# Patient Record
Sex: Female | Born: 1995 | Race: Black or African American | Hispanic: No | Smoking: Never smoker
Health system: Southern US, Community
[De-identification: ages and names within clinical notes are randomized; demographics above are authoritative.]

## PROBLEM LIST (undated history)

## (undated) ENCOUNTER — Ambulatory Visit

## (undated) DIAGNOSIS — G43909 Migraine, unspecified, not intractable, without status migrainosus: Secondary | ICD-10-CM

## (undated) DIAGNOSIS — A6000 Herpesviral infection of urogenital system, unspecified: Secondary | ICD-10-CM

## (undated) DIAGNOSIS — F32A Depression, unspecified: Secondary | ICD-10-CM

## (undated) DIAGNOSIS — D649 Anemia, unspecified: Secondary | ICD-10-CM

## (undated) DIAGNOSIS — D219 Benign neoplasm of connective and other soft tissue, unspecified: Secondary | ICD-10-CM

## (undated) DIAGNOSIS — N390 Urinary tract infection, site not specified: Secondary | ICD-10-CM

## (undated) DIAGNOSIS — F419 Anxiety disorder, unspecified: Secondary | ICD-10-CM

## (undated) HISTORY — PX: TONSILLECTOMY: SUR1361

---

## 2020-01-05 ENCOUNTER — Encounter (HOSPITAL_COMMUNITY): Payer: Self-pay | Admitting: Obstetrics and Gynecology

## 2020-01-05 ENCOUNTER — Emergency Department (HOSPITAL_COMMUNITY)
Admission: EM | Admit: 2020-01-05 | Discharge: 2020-01-05 | Disposition: A | Discharge status: Home / Self Care | Payer: Self-pay | Attending: Emergency Medicine | Admitting: Emergency Medicine

## 2020-01-05 ENCOUNTER — Other Ambulatory Visit: Payer: Self-pay

## 2020-01-05 DIAGNOSIS — N939 Abnormal uterine and vaginal bleeding, unspecified: Secondary | ICD-10-CM | POA: Insufficient documentation

## 2020-01-05 LAB — CBC
HCT: 38.9 % (ref 36.0–46.0)
Hemoglobin: 12.4 g/dL (ref 12.0–15.0)
MCH: 27.5 pg (ref 26.0–34.0)
MCHC: 31.9 g/dL (ref 30.0–36.0)
MCV: 86.3 fL (ref 80.0–100.0)
Platelets: 345 10*3/uL (ref 150–400)
RBC: 4.51 MIL/uL (ref 3.87–5.11)
RDW: 14.6 % (ref 11.5–15.5)
WBC: 7.4 10*3/uL (ref 4.0–10.5)
nRBC: 0 % (ref 0.0–0.2)

## 2020-01-05 LAB — BASIC METABOLIC PANEL
Anion gap: 6 (ref 5–15)
BUN: 15 mg/dL (ref 6–20)
CO2: 28 mmol/L (ref 22–32)
Calcium: 9.1 mg/dL (ref 8.9–10.3)
Chloride: 101 mmol/L (ref 98–111)
Creatinine, Ser: 0.7 mg/dL (ref 0.44–1.00)
GFR calc Af Amer: >60 mL/min (ref >60)
GFR calc non Af Amer: >60 mL/min (ref >60)
Glucose, Bld: 92 mg/dL (ref 70–99)
Potassium: 3.9 mmol/L (ref 3.5–5.1)
Sodium: 135 mmol/L (ref 135–145)

## 2020-01-05 LAB — I-STAT BETA HCG BLOOD, ED (MC, WL, AP ONLY): I-stat hCG, quantitative: <5 m[IU]/mL (ref <5)

## 2020-01-05 NOTE — ED Triage Notes (Signed)
Patient reports she has been having stranger period-like symptoms with no period. Patient denies chance of pregnancy and reports 2 negative tests recently. Patient endorses unprotected sex. Patient denies STI symptoms.

## 2020-01-05 NOTE — Discharge Instructions (Signed)
Having vaginal spotting is a very common thing.  As long as you have a negative pregnancy test and you are otherwise feeling okay this is normally not something to be concerned about. If you develop fevers, have vaginal bleeding with a positive pregnancy test, have menstrual cycles that do not stop for multiple weeks and you feel lightheaded, or you have other concerns please seek additional medical care.  I have given you the information for the [facility] outpatient clinic, you can call them to schedule an appointment.

## 2020-01-05 NOTE — ED Provider Notes (Signed)
[facility] DEPT Provider Note   CSN: [id number] Arrival date & time: 01/05/20  1435     History Chief Complaint  Patient presents with  . Vaginal Bleeding    Sheri Lawrence is a 24 y.o. female who presents today for evaluation of menstrual spotting.  She reports that she had menstrual spotting 6 to 8 days ago and then started her normal menstrual cycle after.  She reports that she is currently having a normal menstrual cycle.  Her previous one was normal.   She denies any abnormal pelvic pain.  She states that she recently got back from [facility] and shortly before she went she got tested for STIs so she is not concerned about that.  She denies any trauma.   HPI     History reviewed. No pertinent past medical history.  There are no problems to display for this patient.   History reviewed. No pertinent surgical history.   OB History   No obstetric history on file.     History reviewed. No pertinent family history.  Social History   Tobacco Use  . Smoking status: Never Smoker  . Smokeless tobacco: Never Used  Substance Use Topics  . Alcohol use: Not on file  . Drug use: Not on file    Home Medications Prior to Admission medications   Not on File    Allergies    Patient has no allergy information on record.  Review of Systems   Review of Systems  Constitutional: Negative for chills and fever.  Genitourinary: Positive for menstrual problem and vaginal bleeding. Negative for vaginal discharge.  Musculoskeletal: Negative for back pain and neck pain.  All other systems reviewed and are negative.   Physical Exam Updated Vital Signs BP 135/79 (BP Location: Right Arm)   Pulse 76   Temp 98.7 F (37.1 C) (Oral)   Resp 18   Ht 5\' 4"  (1.626 m)   Wt 83 kg   LMP 11/28/2019 (Exact Date)   SpO2 99%   BMI 31.41 kg/m   Physical Exam Vitals and nursing note reviewed.  Constitutional:      General: She is not in acute  distress.    Appearance: She is not ill-appearing.  HENT:     Head: Normocephalic.  Cardiovascular:     Rate and Rhythm: Normal rate.  Pulmonary:     Effort: Pulmonary effort is normal. No respiratory distress.  Neurological:     Mental Status: She is alert.  Psychiatric:        Mood and Affect: Mood normal.        Behavior: Behavior normal.     ED Results / Procedures / Treatments   Labs (all labs ordered are listed, but only abnormal results are displayed) Labs Reviewed  CBC  BASIC METABOLIC PANEL  I-STAT BETA HCG BLOOD, ED ([facility], [facility], [facility] ONLY)    EKG None  Radiology No results found.  Procedures Procedures (including critical care time)  Medications Ordered in ED Medications - No data to display  ED Course  I have reviewed the triage vital signs and the nursing notes.  Pertinent labs & imaging results that were available during my care of the patient were reviewed by me and considered in my medical decision making (see chart for details).    MDM Rules/Calculators/A&P                         Patient is a 24 year old woman  who presents today for evaluation of 2 days of vaginal spotting preceding her normal menstrual cycle.  The vaginal spotting occurred over a week ago.  She denies any fevers, or abnormal pelvic pain.  No concern for STIs.  Labs are obtained by triage, hCG is negative.  CMP and BMP are unremarkable.  She denies any concern for trauma or tears. She reports she has never had spotting before which caused her concern.   Patient reassured.    Outpatient follow up ordered.   Return precautions were discussed with patient who states their understanding.  At the time of discharge patient denied any unaddressed complaints or concerns.  Patient is agreeable for discharge home.  Note: Portions of this report may have been transcribed using voice recognition software. Every effort was made to ensure accuracy; however, inadvertent computerized transcription  errors may be present.  Final Clinical Impression(s) / ED Diagnoses Final diagnoses:  Vaginal spotting    Rx / DC Orders ED Discharge Orders    None       Lorin Glass, PA-C 01/05/20 1812    Lennice Sites, DO 01/05/20 1903

## 2020-01-20 ENCOUNTER — Encounter (HOSPITAL_COMMUNITY): Payer: Self-pay | Admitting: Physician Assistant

## 2020-02-04 ENCOUNTER — Encounter (HOSPITAL_COMMUNITY): Payer: Self-pay | Admitting: Emergency Medicine

## 2020-02-04 ENCOUNTER — Emergency Department (HOSPITAL_COMMUNITY)
Admission: EM | Admit: 2020-02-04 | Discharge: 2020-02-04 | Disposition: A | Discharge status: Home / Self Care | Payer: Self-pay | Attending: Emergency Medicine | Admitting: Emergency Medicine

## 2020-02-04 DIAGNOSIS — Z5321 Procedure and treatment not carried out due to patient leaving prior to being seen by health care provider: Secondary | ICD-10-CM | POA: Insufficient documentation

## 2020-02-04 DIAGNOSIS — Y999 Unspecified external cause status: Secondary | ICD-10-CM | POA: Insufficient documentation

## 2020-02-04 DIAGNOSIS — Y92410 Unspecified street and highway as the place of occurrence of the external cause: Secondary | ICD-10-CM | POA: Insufficient documentation

## 2020-02-04 DIAGNOSIS — Y9389 Activity, other specified: Secondary | ICD-10-CM | POA: Insufficient documentation

## 2020-02-04 DIAGNOSIS — M79642 Pain in left hand: Secondary | ICD-10-CM | POA: Insufficient documentation

## 2020-02-04 NOTE — ED Notes (Signed)
Patient did not answer for room. Informed by lobby staff patient left
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
## 2020-02-06 ENCOUNTER — Telehealth (HOSPITAL_COMMUNITY): Payer: Self-pay

## 2020-02-06 ENCOUNTER — Ambulatory Visit (HOSPITAL_COMMUNITY)
Admission: RE | Admit: 2020-02-06 | Discharge: 2020-02-06 | Disposition: A | Discharge status: Home / Self Care | Payer: Self-pay | Source: Ambulatory Visit | Attending: Family Medicine | Admitting: Family Medicine

## 2020-02-06 ENCOUNTER — Encounter (HOSPITAL_COMMUNITY): Payer: Self-pay

## 2020-02-06 ENCOUNTER — Other Ambulatory Visit: Payer: Self-pay

## 2020-02-06 VITALS — BP 116/64 | HR 72 | Temp 98.7°F | Resp 17

## 2020-02-06 DIAGNOSIS — M545 Low back pain, unspecified: Secondary | ICD-10-CM

## 2020-02-06 MED ORDER — CYCLOBENZAPRINE HCL 10 MG PO TABS
5.0000 mg | ORAL_TABLET | Freq: Every day | ORAL | 0 refills | Status: DC
Start: 2020-02-06 — End: 2020-07-01

## 2020-02-06 MED ORDER — IBUPROFEN 600 MG PO TABS
600.0000 mg | ORAL_TABLET | Freq: Three times a day (TID) | ORAL | 0 refills | Status: DC | PRN
Start: 2020-02-06 — End: 2020-07-01
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
## 2020-02-06 NOTE — Discharge Instructions (Signed)
Medicine as needed Work note for light duty  Rest, heat to the back.  Follow up as needed for continued or worsening symptoms
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
## 2020-05-26 NOTE — L&D Delivery Note (Signed)
[facility] Delivery Note  Sheri Lawrence is a 25 y.o. G1P0 s/p vag del at [redacted]w[redacted]d. She was admitted for PROM.   ROM: 18h 65m with clear fluid GBS Status: neg Maximum Maternal Temperature: 98.5  Labor Progress: Ms Vannest was admitted with PROM at midnight on 02/12/21 with expectant management overnight and a buccal cytotec for ripening in the morning, which began her labor and she progressed to complete, delivering in the tub.  Delivery Date/Time: February 12, 2021 at 1805 Delivery: Called to room and patient was complete and pushing. Head delivered LOA. No nuchal cord present. Shoulder and body delivered in usual fashion. Infant with spontaneous cry, placed on mother's abdomen, dried and stimulated. Cord clamped x 2 after a delay, and cut by FOB. Pt assisted to the bed without difficulty. Cord blood drawn. Placenta delivered spontaneously with gentle cord traction. Fundus firm with massage and Pitocin. Labia, perineum, vagina, and cervix inspected and found to be intact.   Placenta: spont, intact; to L&D Complications: none Lacerations: none EBL: 300cc Analgesia: none  Postpartum Planning [x]  message to sent to schedule follow-up   Infant: girl  APGARs 9/9  2934g (6lb 7.5oz)  [name], CNM  02/12/2021 6:45 PM

## 2020-06-30 ENCOUNTER — Other Ambulatory Visit: Payer: Self-pay

## 2020-06-30 ENCOUNTER — Encounter (HOSPITAL_COMMUNITY): Payer: Self-pay | Admitting: Emergency Medicine

## 2020-06-30 DIAGNOSIS — O23591 Infection of other part of genital tract in pregnancy, first trimester: Secondary | ICD-10-CM | POA: Insufficient documentation

## 2020-06-30 DIAGNOSIS — B9689 Other specified bacterial agents as the cause of diseases classified elsewhere: Secondary | ICD-10-CM | POA: Insufficient documentation

## 2020-06-30 DIAGNOSIS — Z3A01 Less than 8 weeks gestation of pregnancy: Secondary | ICD-10-CM | POA: Insufficient documentation

## 2020-06-30 DIAGNOSIS — O209 Hemorrhage in early pregnancy, unspecified: Secondary | ICD-10-CM | POA: Insufficient documentation

## 2020-06-30 NOTE — ED Triage Notes (Signed)
Patient reports 6 weeks today and had blood on toilet paper after urinating tonight. Denies pain.

## 2020-07-01 ENCOUNTER — Emergency Department (HOSPITAL_COMMUNITY)
Admission: EM | Admit: 2020-07-01 | Discharge: 2020-07-01 | Disposition: A | Discharge status: Home / Self Care | Payer: Self-pay | Attending: Emergency Medicine | Admitting: Emergency Medicine

## 2020-07-01 ENCOUNTER — Emergency Department (HOSPITAL_COMMUNITY): Payer: Self-pay

## 2020-07-01 DIAGNOSIS — B9689 Other specified bacterial agents as the cause of diseases classified elsewhere: Secondary | ICD-10-CM

## 2020-07-01 DIAGNOSIS — O4690 Antepartum hemorrhage, unspecified, unspecified trimester: Secondary | ICD-10-CM

## 2020-07-01 LAB — I-STAT BETA HCG BLOOD, ED (MC, WL, AP ONLY): I-stat hCG, quantitative: >2000 m[IU]/mL — ABNORMAL HIGH (ref <5)

## 2020-07-01 LAB — URINALYSIS, ROUTINE W REFLEX MICROSCOPIC
Bacteria, UA: NONE SEEN
Bilirubin Urine: NEGATIVE
Glucose, UA: NEGATIVE mg/dL
Ketones, ur: 5 mg/dL — AB
Leukocytes,Ua: NEGATIVE
Nitrite: NEGATIVE
Protein, ur: 30 mg/dL — AB
Specific Gravity, Urine: 1.027 (ref 1.005–1.030)
pH: 5 (ref 5.0–8.0)

## 2020-07-01 LAB — WET PREP, GENITAL
Sperm: NONE SEEN
Trich, Wet Prep: NONE SEEN
Yeast Wet Prep HPF POC: NONE SEEN

## 2020-07-01 LAB — HCG, QUANTITATIVE, PREGNANCY: hCG, Beta Chain, Quant, S: 20027 m[IU]/mL — ABNORMAL HIGH (ref <5)

## 2020-07-01 NOTE — Discharge Instructions (Addendum)
Thank you for allowing me to care for you today in the Emergency Department.   You were seen today for vaginal bleeding.  You had a positive pregnancy test.  We did a pelvic ultrasound that measured that you are 5 weeks and 5 days on your pregnancy.  It was recommended that you have a repeat ultrasound in 1 week to measure the baby's heart rate since right now you are around the time of the pregnancy where this can be very difficult to measure.  Please follow-up with your OB/GYN in 2 to 3 days to have your hCG levels rechecked.  I have also provided you with an OB/GYN referral in [facility] if you would prefer to have a local OB/GYN.  Their office information is listed above.  Return to the emergency department if you have significantly worsening vaginal bleeding, high fevers, uncontrollable vomiting, severe abdominal pain, if you pass out, or have other new, concerning symptoms.  Please note that at [facility] in [place] that we also have a special unit called the women's and children's Center where you can present for any emergent complaints related to your pregnancy 24/7.  [place] at [facility] [address] [place],  [place]  [id number]

## 2020-07-01 NOTE — ED Notes (Signed)
Discharge paperwork reviewed with pt, including f/u prenatal care and referrals.  Pt verbalized understanding, ambulatory to ED exit.

## 2020-07-01 NOTE — ED Provider Notes (Signed)
[facility] DEPT Provider Note   CSN: [id number] Arrival date & time: 06/30/20  2157     History Chief Complaint  Patient presents with  . Vaginal Bleeding    Sheri Lawrence is a 25 y.o. female, G1P0, who presents the emergency department with a chief complaint of vaginal bleeding.  The patient endorses vaginal bleeding, onset today.  She reports that she had an at home pregnancy test that was + January 24.  Her last menstrual cycle was December 25.  She has a scheduled OB/GYN appointment upcoming with an OB/GYN practice in [facility] [place], [place].  Approximately 5 days ago, she began having intermittent abdominal cramping and spoke with the OB/GYN practice and was advised that this can be normal in early pregnancy, but if she developed vaginal fluid leaking or bleeding that she should go to the ER.  She reports that earlier tonight that she was wiping and noticed some pink tinged blood on the toilet tissue.  She also had a small amount of bright red blood in the toilet.  No clots.  She continues to have abdominal pain and cramping that is intermittent.  No known aggravating or alleviating factors.  She denies chest pain, shortness of breath, back pain, dysuria, hematuria, vaginal discharge nausea, vomiting, diarrhea, constipation.  She has no chronic medical conditions.  She is sexually active with one female partner.  No concerns for STIs.  She has already initiated taking daily prenatal vitamins.  The history is provided by the patient and medical records. No language interpreter was used.       History reviewed. No pertinent past medical history.  There are no problems to display for this patient.   History reviewed. No pertinent surgical history.   OB History   No obstetric history on file.     Family History  Family history unknown: Yes    Social History   Tobacco Use  . Smoking status: Never Smoker  . Smokeless tobacco:  Never Used    Home Medications Prior to Admission medications   Medication Sig Start Date End Date Taking? Authorizing Provider  Prenatal Vit-Fe Fumarate-FA (PRENATAL PO) Take 2 tablets by mouth daily. Prenatal Gummies   Yes [provider]    Allergies    Patient has no known allergies.  Review of Systems   Review of Systems  Constitutional: Negative for activity change, chills and fever.  HENT: Negative for congestion and sore throat.   Respiratory: Negative for cough, shortness of breath and wheezing.   Cardiovascular: Negative for chest pain and palpitations.  Gastrointestinal: Positive for abdominal pain. Negative for constipation, diarrhea, nausea and vomiting.  Genitourinary: Positive for vaginal bleeding. Negative for dysuria.  Musculoskeletal: Negative for back pain and myalgias.  Skin: Negative for rash and wound.  Allergic/Immunologic: Negative for immunocompromised state.  Neurological: Negative for dizziness, seizures, syncope, weakness, numbness and headaches.  Psychiatric/Behavioral: Negative for confusion.    Physical Exam Updated Vital Signs BP 134/78   Pulse 80   Temp 97.9 F (36.6 C) (Oral)   Resp 15   Ht 5\' 4"  (1.626 m)   Wt 88 kg   SpO2 100%   BMI 33.30 kg/m   Physical Exam Vitals and nursing note reviewed.  Constitutional:      General: She is not in acute distress.    Appearance: She is not ill-appearing, toxic-appearing or diaphoretic.  HENT:     Head: Normocephalic.     Mouth/Throat:  Mouth: Mucous membranes are moist.     Pharynx: No oropharyngeal exudate or posterior oropharyngeal erythema.  Eyes:     Conjunctiva/sclera: Conjunctivae normal.  Cardiovascular:     Rate and Rhythm: Normal rate and regular rhythm.     Pulses: Normal pulses.     Heart sounds: Normal heart sounds. No murmur heard. No friction rub. No gallop.   Pulmonary:     Effort: Pulmonary effort is normal. No respiratory distress.     Breath sounds: No  stridor. No wheezing, rhonchi or rales.  Chest:     Chest wall: No tenderness.  Abdominal:     General: There is no distension.     Palpations: Abdomen is soft. There is no mass.     Tenderness: There is no abdominal tenderness. There is no right CVA tenderness, left CVA tenderness, guarding or rebound.     Hernia: No hernia is present.  Genitourinary:    Comments: Chaperoned exam.  Scants amount of blood noted in the vaginal vault  Cervix is closed.  No cervical motion tenderness.  No adnexal masses or tenderness Musculoskeletal:     Cervical back: Neck supple.     Right lower leg: No edema.     Left lower leg: No edema.  Skin:    General: Skin is warm.     Findings: No rash.  Neurological:     Mental Status: She is alert.  Psychiatric:        Behavior: Behavior normal.     ED Results / Procedures / Treatments   Labs (all labs ordered are listed, but only abnormal results are displayed) Labs Reviewed  WET PREP, GENITAL - Abnormal; Notable for the following components:      Result Value   Clue Cells Wet Prep HPF POC PRESENT (*)    WBC, Wet Prep HPF POC RARE (*)    All other components within normal limits  HCG, QUANTITATIVE, PREGNANCY - Abnormal; Notable for the following components:   hCG, Beta Chain, Quant, S 20,027 (*)    All other components within normal limits  URINALYSIS, ROUTINE W REFLEX MICROSCOPIC - Abnormal; Notable for the following components:   Hgb urine dipstick MODERATE (*)    Ketones, ur 5 (*)    Protein, ur 30 (*)    All other components within normal limits  I-STAT BETA HCG BLOOD, ED (MC, WL, AP ONLY) - Abnormal; Notable for the following components:   I-stat hCG, quantitative >2,000.0 (*)    All other components within normal limits  GC/CHLAMYDIA PROBE AMP ([place]) NOT AT [facility]    EKG None  Radiology US OB Comp < 14 Wks  Result Date: 07/01/2020 CLINICAL DATA:  Vaginal bleeding. Last menstrual period 05/19/2020. Gestational age by last  menstrual period of 6 weeks and 1 day. Estimated due date by last menstrual period 02/23/2021. Quantitative beta HCG of 20,027 EXAM: OBSTETRIC <14 WK [place] AND TRANSVAGINAL OB US TECHNIQUE: Both transabdominal and transvaginal ultrasound examinations were performed for complete evaluation of the gestation as well as the maternal uterus, adnexal regions, and pelvic cul-de-sac. Transvaginal technique was performed to assess early pregnancy. COMPARISON:  None. FINDINGS: Intrauterine gestational sac: Single Yolk sac:  Visualized. Embryo:  Visualized. Cardiac Activity: Visualized. Heart Rate: Per ultrasound technologist, cardiac activity is visualized but difficult to measure. CRL:  2 mm   5 w   5 d                  [place] EDC:  02/26/2021 Subchorionic hemorrhage:  None visualized. Maternal uterus/adnexae: There is a posterior fundal intramural hypodense lesion measuring 1.8 x 1.6 x 1.4 cm consistent with a uterine fibroid. The remainder of the uterus is unremarkable. The right ovary is unremarkable and measures 2.3 x 4.3 x 1.6 cm. The left ovary is unremarkable and measures 2 x 3 x 1.2 cm. Bilateral adnexal regions are otherwise unremarkable. Other: No free intraperitoneal fluid within the pelvis. IMPRESSION: 1. Single intrauterine pregnancy with difficulty to obtain fetal heart rate even though cardiac activity is visualized. This may be due to an early intrauterine pregnancy timeline. Recommend repeat ultrasound in 7 days for further evaluation of viability. 2. Gestational age by ultrasound of 5 weeks and 5 days is concordant with gestational age by last menstrual period of 6 weeks and 1 day. 3. Posterior fundal intramural uterine fibroid measuring up to 1.8 cm. Electronically Signed   By: Iven Finn M.D.   On: 07/01/2020 03:52   US OB Transvaginal  Result Date: 07/01/2020 CLINICAL DATA:  Vaginal bleeding. Last menstrual period 05/19/2020. Gestational age by last menstrual period of 6 weeks and 1 day. Estimated due  date by last menstrual period 02/23/2021. Quantitative beta HCG of 20,027 EXAM: OBSTETRIC <14 WK [place] AND TRANSVAGINAL OB US TECHNIQUE: Both transabdominal and transvaginal ultrasound examinations were performed for complete evaluation of the gestation as well as the maternal uterus, adnexal regions, and pelvic cul-de-sac. Transvaginal technique was performed to assess early pregnancy. COMPARISON:  None. FINDINGS: Intrauterine gestational sac: Single Yolk sac:  Visualized. Embryo:  Visualized. Cardiac Activity: Visualized. Heart Rate: Per ultrasound technologist, cardiac activity is visualized but difficult to measure. CRL:  2 mm   5 w   5 d                  [place] EDC: 02/26/2021 Subchorionic hemorrhage:  None visualized. Maternal uterus/adnexae: There is a posterior fundal intramural hypodense lesion measuring 1.8 x 1.6 x 1.4 cm consistent with a uterine fibroid. The remainder of the uterus is unremarkable. The right ovary is unremarkable and measures 2.3 x 4.3 x 1.6 cm. The left ovary is unremarkable and measures 2 x 3 x 1.2 cm. Bilateral adnexal regions are otherwise unremarkable. Other: No free intraperitoneal fluid within the pelvis. IMPRESSION: 1. Single intrauterine pregnancy with difficulty to obtain fetal heart rate even though cardiac activity is visualized. This may be due to an early intrauterine pregnancy timeline. Recommend repeat ultrasound in 7 days for further evaluation of viability. 2. Gestational age by ultrasound of 5 weeks and 5 days is concordant with gestational age by last menstrual period of 6 weeks and 1 day. 3. Posterior fundal intramural uterine fibroid measuring up to 1.8 cm. Electronically Signed   By: Iven Finn M.D.   On: 07/01/2020 03:52    Procedures Procedures   Medications Ordered in ED Medications - No data to display  ED Course  I have reviewed the triage vital signs and the nursing notes.  Pertinent labs & imaging results that were available during my care of the  patient were reviewed by me and considered in my medical decision making (see chart for details).    MDM Rules/Calculators/A&P                          25 year old G51P0 female with no chronic medical conditions who presents to the emergency department with a chief complaint of vaginal bleeding, onset tonight with intermittent abdominal cramping over  the last few days.  She had a positive home pregnancy test on January 24 and her last menstrual cycle was 12/25.  Differential diagnosis includes ectopic pregnancy, miscarriage, subchorionic hemorrhage, STI, UTI, obstructive ureterolithiasis.  Vital signs are stable.  Labs and imaging have been reviewed and independently interpreted by me.  I-STAT hCG greater than 2000.  Quantitative hCG is 20K.  Pelvic ultrasound with single intrauterine pregnancy.  Cardiac activity is visualized, but fetal heart rate is difficult to obtain.  This is thought to be secondary to an early intrauterine pregnancy timeline.  This would not be unexpected as gestational age as measured at 44 weeks and 5 days and patient's last menstrual cycle was approximately 6 weeks ago.  There is also a posterior fundal intra-year-old uterine fibroid measuring 1.8 cm.  Given scant amount of vaginal bleeding, obtaining CBC is not indicated at this time.  UA with mild proteinuria.  No evidence of bacteria.  Wet prep with clue cells.  Will defer treatment at this time until she is seen by her OB/GYN since she is in the first trimester.  Patient is aware that GC chlamydia are pending, but I have a low suspicion for positive test at this time to her warrant prophylactic management given patient's sexual history.  The patient has been advised that she needs to follow-up with OB/GYN for repeat hCG in 2 to 3 days.  She also needs to follow-up for a repeat pelvic ultrasound in 1 week.  All questions answered.  The patient is hemodynamically stable and in no acute distress.  Safe for discharge home with  outpatient follow-up to OB/GYN.  ER return precautions given.  Final Clinical Impression(s) / ED Diagnoses Final diagnoses:  Vaginal bleeding in pregnancy  BV (bacterial vaginosis)    Rx / DC Orders ED Discharge Orders    None       Joanne Gavel, PA-C [id number]    Delora Fuel, MD [id number] [phone]

## 2020-07-02 LAB — GC/CHLAMYDIA PROBE AMP (~~LOC~~) NOT AT ARMC
Chlamydia: NEGATIVE
Comment: NEGATIVE
Comment: NORMAL
Neisseria Gonorrhea: NEGATIVE

## 2020-07-23 ENCOUNTER — Inpatient Hospital Stay (HOSPITAL_COMMUNITY): Payer: BC Managed Care – PPO

## 2020-07-23 ENCOUNTER — Other Ambulatory Visit: Payer: Self-pay

## 2020-07-23 ENCOUNTER — Inpatient Hospital Stay (HOSPITAL_COMMUNITY)
Admission: AD | Admit: 2020-07-23 | Discharge: 2020-07-23 | Disposition: A | Discharge status: Home / Self Care | Payer: BC Managed Care – PPO | Attending: Obstetrics & Gynecology | Admitting: Obstetrics & Gynecology

## 2020-07-23 ENCOUNTER — Encounter (HOSPITAL_COMMUNITY): Payer: Self-pay | Admitting: Obstetrics & Gynecology

## 2020-07-23 DIAGNOSIS — N930 Postcoital and contact bleeding: Secondary | ICD-10-CM

## 2020-07-23 DIAGNOSIS — R102 Pelvic and perineal pain: Secondary | ICD-10-CM

## 2020-07-23 DIAGNOSIS — R1030 Lower abdominal pain, unspecified: Secondary | ICD-10-CM | POA: Diagnosis not present

## 2020-07-23 DIAGNOSIS — O26891 Other specified pregnancy related conditions, first trimester: Secondary | ICD-10-CM | POA: Diagnosis not present

## 2020-07-23 DIAGNOSIS — B9689 Other specified bacterial agents as the cause of diseases classified elsewhere: Secondary | ICD-10-CM | POA: Diagnosis not present

## 2020-07-23 DIAGNOSIS — O23591 Infection of other part of genital tract in pregnancy, first trimester: Secondary | ICD-10-CM | POA: Insufficient documentation

## 2020-07-23 DIAGNOSIS — O209 Hemorrhage in early pregnancy, unspecified: Secondary | ICD-10-CM

## 2020-07-23 DIAGNOSIS — O99891 Other specified diseases and conditions complicating pregnancy: Secondary | ICD-10-CM

## 2020-07-23 DIAGNOSIS — N760 Acute vaginitis: Secondary | ICD-10-CM | POA: Diagnosis not present

## 2020-07-23 DIAGNOSIS — Z3A09 9 weeks gestation of pregnancy: Secondary | ICD-10-CM

## 2020-07-23 DIAGNOSIS — Z3491 Encounter for supervision of normal pregnancy, unspecified, first trimester: Secondary | ICD-10-CM

## 2020-07-23 LAB — CBC
HCT: 34.9 % — ABNORMAL LOW (ref 36.0–46.0)
Hemoglobin: 11 g/dL — ABNORMAL LOW (ref 12.0–15.0)
MCH: 26.8 pg (ref 26.0–34.0)
MCHC: 31.5 g/dL (ref 30.0–36.0)
MCV: 84.9 fL (ref 80.0–100.0)
Platelets: 329 10*3/uL (ref 150–400)
RBC: 4.11 MIL/uL (ref 3.87–5.11)
RDW: 14.2 % (ref 11.5–15.5)
WBC: 7.9 10*3/uL (ref 4.0–10.5)
nRBC: 0 % (ref 0.0–0.2)

## 2020-07-23 LAB — URINALYSIS, ROUTINE W REFLEX MICROSCOPIC
Bacteria, UA: NONE SEEN
Bilirubin Urine: NEGATIVE
Glucose, UA: NEGATIVE mg/dL
Ketones, ur: NEGATIVE mg/dL
Leukocytes,Ua: NEGATIVE
Nitrite: NEGATIVE
Protein, ur: 30 mg/dL — AB
RBC / HPF: >50 RBC/hpf — ABNORMAL HIGH (ref 0–5)
Specific Gravity, Urine: 1.021 (ref 1.005–1.030)
pH: 5 (ref 5.0–8.0)

## 2020-07-23 LAB — WET PREP, GENITAL
Sperm: NONE SEEN
Trich, Wet Prep: NONE SEEN
Yeast Wet Prep HPF POC: NONE SEEN

## 2020-07-23 LAB — GC/CHLAMYDIA PROBE AMP (~~LOC~~) NOT AT ARMC
Chlamydia: NEGATIVE
Comment: NEGATIVE
Comment: NORMAL
Neisseria Gonorrhea: NEGATIVE

## 2020-07-23 LAB — ABO/RH: ABO/RH(D): A POS

## 2020-07-23 LAB — HIV ANTIBODY (ROUTINE TESTING W REFLEX): HIV Screen 4th Generation wRfx: NONREACTIVE

## 2020-07-23 LAB — HCG, QUANTITATIVE, PREGNANCY: hCG, Beta Chain, Quant, S: 88406 m[IU]/mL — ABNORMAL HIGH (ref <5)

## 2020-07-23 MED ORDER — METRONIDAZOLE 500 MG PO TABS: 500.0000 mg | ORAL_TABLET | Freq: Two times a day (BID) | ORAL | 0 refills | Status: DC

## 2020-07-23 NOTE — MAU Provider Note (Signed)
History     CSN: [id number]  Arrival date and time: 07/23/20 0417   Event Date/Time   First Provider Initiated Contact with Patient 07/23/20 [phone]      Chief Complaint  Patient presents with  . Vaginal Bleeding  . Abdominal Pain   Ms. Sheri Lawrence is a 25 y.o. year old G1P0 female at [redacted]w[redacted]d weeks gestation by LMP who presents to MAU reporting vaginal bleeding and lower abdominal cramping after having SI about 2 hours ago. She was seen at [facility] on 07/01/2020, U/S with an IUP at 5.5 wks/(+) YS/(+) embryo/(+) cardiac activity but too small to measure/fundal intramural fibroid 1.8 cm, and HCG 20,027. She was advised to have a follow-up U/S 1 week after the one on 07/01/2020. She was seen at [facility] in [place], [place] on Monday 07/16/2020. She states she had another U/S that "was good" at that visit. She does not plan to receive [facility] in [place], [place]. She lives in [place] area and requesting a list of providers.   OB History    Gravida  1   Para      Term      Preterm      AB      Living        SAB      IAB      Ectopic      Multiple      Live Births              History reviewed. No pertinent past medical history.  Past Surgical History:  Procedure Laterality Date  . TONSILLECTOMY      Family History  Family history unknown: Yes    Social History   Tobacco Use  . Smoking status: Never Smoker  . Smokeless tobacco: Never Used  Vaping Use  . Vaping Use: Never used  Substance Use Topics  . Alcohol use: Not Currently  . Drug use: Never    Allergies: No Known Allergies  Medications Prior to Admission  Medication Sig Dispense Refill Last Dose  . Prenatal Vit-Fe Fumarate-FA (PRENATAL PO) Take 2 tablets by mouth daily. Prenatal Gummies       Review of Systems  Constitutional: Negative.   HENT: Negative.   Eyes: Negative.   Respiratory: Negative.   Cardiovascular: Negative.   Gastrointestinal: Negative.   Endocrine: Negative.   Genitourinary:  Positive for pelvic pain and vaginal bleeding.  Musculoskeletal: Negative.   Skin: Negative.   Allergic/Immunologic: Negative.   Neurological: Negative.   Hematological: Negative.   Psychiatric/Behavioral: Negative.    Physical Exam   Blood pressure 108/60, pulse 96, temperature 98.3 F (36.8 C), temperature source Oral, resp. rate 18, weight 88 kg, last menstrual period 05/19/2020, SpO2 99 %.  Physical Exam Vitals reviewed. Exam conducted with a chaperone present.  Constitutional:      Appearance: Normal appearance. She is normal weight.  Cardiovascular:     Rate and Rhythm: Normal rate.  Abdominal:     Palpations: Abdomen is soft.  Genitourinary:    General: Normal vulva.     Comments: Uterus: non-tender, SE: cervix is smooth, pink, no lesions, small amt of dark, red blood with small blood clot coming out of cervical os -- WP, GC/CT done, closed/long/firm, no CMT or friability, no adnexal tenderness  Musculoskeletal:        General: Normal range of motion.     Cervical back: Normal range of motion.  Skin:    General: Skin is warm and dry.  Neurological:     Mental Status: She is alert and oriented to person, place, and time.  Psychiatric:        Mood and Affect: Mood normal.        Behavior: Behavior normal.        Thought Content: Thought content normal.        Judgment: Judgment normal.     MAU Course  Procedures  MDM CCUA UPT CBC ABO/Rh HCG Wet Prep GC/CT -- pending HIV -- pending OB < 14 wks [place] with TV  Results for orders placed or performed during the hospital encounter of 07/23/20 (from the past 24 hour(s))  Wet prep, genital     Status: Abnormal   Collection Time: 07/23/20  5:21 AM   Specimen: Vaginal  Result Value Ref Range   Yeast Wet Prep HPF POC NONE SEEN NONE SEEN   Trich, Wet Prep NONE SEEN NONE SEEN   Clue Cells Wet Prep HPF POC PRESENT (A) NONE SEEN   WBC, Wet Prep HPF POC FEW (A) NONE SEEN   Sperm NONE SEEN   Urinalysis, Routine w  reflex microscopic Urine, Clean Catch     Status: Abnormal   Collection Time: 07/23/20  5:21 AM  Result Value Ref Range   Color, Urine YELLOW YELLOW   APPearance CLOUDY (A) CLEAR   Specific Gravity, Urine 1.021 1.005 - 1.030   pH 5.0 5.0 - 8.0   Glucose, UA NEGATIVE NEGATIVE mg/dL   Hgb urine dipstick LARGE (A) NEGATIVE   Bilirubin Urine NEGATIVE NEGATIVE   Ketones, ur NEGATIVE NEGATIVE mg/dL   Protein, ur 30 (A) NEGATIVE mg/dL   Nitrite NEGATIVE NEGATIVE   Leukocytes,Ua NEGATIVE NEGATIVE   RBC / HPF >50 (H) 0 - 5 RBC/hpf   WBC, UA 0-5 0 - 5 WBC/hpf   Bacteria, UA NONE SEEN NONE SEEN   Squamous Epithelial / LPF 0-5 0 - 5  ABO/Rh     Status: None   Collection Time: 07/23/20  5:54 AM  Result Value Ref Range   ABO/RH(D) A POS    No rh immune globuloin      NOT A RH IMMUNE GLOBULIN CANDIDATE, PT RH POSITIVE Performed at [facility], [address] [place], [place] [address]   hCG, quantitative, pregnancy     Status: Abnormal   Collection Time: 07/23/20  5:54 AM  Result Value Ref Range   hCG, Beta Chain, Quant, S 88,406 (H) <5 mIU/mL  CBC     Status: Abnormal   Collection Time: 07/23/20  5:54 AM  Result Value Ref Range   WBC 7.9 4.0 - 10.5 K/uL   RBC 4.11 3.87 - 5.11 MIL/uL   Hemoglobin 11.0 (L) 12.0 - 15.0 g/dL   HCT 34.9 (L) 36.0 - 46.0 %   MCV 84.9 80.0 - 100.0 fL   MCH 26.8 26.0 - 34.0 pg   MCHC 31.5 30.0 - 36.0 g/dL   RDW 14.2 11.5 - 15.5 %   Platelets 329 150 - 400 K/uL   nRBC 0.0 0.0 - 0.2 %   Per notations made on U/S images by Dr. [place] Crane; no final report due to downtime -- report will take > 1 hour to generate: Single living IUP @ 9.0 wks by CRL, no [place] noted  FHR found to be 171 bpm on image notations   Assessment and Plan  Postcoital bleeding - Plan: Discharge patient - Explained this is a normal variation in pregnancy, but she must exercise caution and  refrain from SI until VB stops Bleeding in early pregnancy  - Return to MAU:  If you have heavier  bleeding that soaks through more that 2 pads per hour for an hour or more  If you bleed so much that you feel like you might pass out or you do pass out  If you have significant abdominal pain that is not improved with Tylenol 1000 mg every 6 hours as needed for pain  If you develop a fever > 100.5 - Information provided on VB in pregnancy   Bacterial vaginosis - Rx for Flagyl 500 mg BID x 7 days - Information provided on BV   [redacted] weeks gestation of pregnancy  Viable pregnancy in first trimester - Patient notified of U/S results - Encouraged to establish [facility] locally ASAP - List of [place] [facility] providers and safe meds in pregnancy given  Laury Deep, CNM 07/23/2020, 4:57 AM

## 2020-07-23 NOTE — Discharge Instructions (Signed)
Return to MAU:  If you have heavier bleeding that soaks through more that 2 pads per hour for an hour or more  If you bleed so much that you feel like you might pass out or you do pass out  If you have significant abdominal pain that is not improved with Tylenol 1000 mg every 6 hours as needed for pain  If you develop a fever > 100.5  Refrain from sex until bleeding has stopped   [facility] [place] [facility] [place] @ [facility]  [place] [phone]  [facility] [place] @ [place]  [phone]  [facility] @ [facility]       [address] [phone]            [facility] [place] @ [place]     [phone]          [facility] [place] @ [place]   [address] [phone]  [facility] [place] @ [place] [phone]     [facility] [place] @ [place])  [place]   [phone]     [facility]  Phone: [facility]  Phone: [facility] Phone: [phone]  [facility] Phone: [phone]  [facility] Phone: [phone]  [facility] Phone: [phone]  [facility]  Phone: [phone]  Safe Medications in Pregnancy   Acne: Benzoyl Peroxide Salicylic Acid  Backache/Headache: Tylenol: 2 regular strength every 4 hours OR              2 Extra strength every 6 hours  Colds/Coughs/Allergies: Benadryl (alcohol free) 25 mg every 6 hours as needed Breath right strips Claritin Cepacol throat lozenges Chloraseptic throat spray Cold-Eeze- up to three times per day Cough drops, alcohol free Flonase (by prescription only) Guaifenesin Mucinex Robitussin DM (plain only, alcohol  free) Saline nasal spray/drops Sudafed (pseudoephedrine) & Actifed ** use only after [redacted] weeks gestation and if you do not have high blood pressure Tylenol Vicks Vaporub Zinc lozenges Zyrtec   Constipation: Colace Ducolax suppositories Fleet enema Glycerin suppositories Metamucil Milk of magnesia Miralax Senokot Smooth move tea  Diarrhea: Kaopectate Imodium A-D  *NO pepto Bismol  Hemorrhoids: Anusol Anusol HC Preparation H Tucks  Indigestion: Tums Maalox Mylanta Zantac  Pepcid  Insomnia: Benadryl (alcohol free) 25mg  every 6 hours as needed Tylenol PM Unisom, no Gelcaps  Leg Cramps: Tums MagGel  Nausea/Vomiting:  [place] Dramamine Emetrol Ginger extract Sea bands Meclizine  Nausea medication to take during pregnancy:  Unisom (doxylamine succinate 25 mg tablets) Take one tablet daily at bedtime. If symptoms are not adequately controlled, the dose can be increased to a maximum recommended dose of two tablets daily (1/2 tablet in the morning, 1/2 tablet mid-afternoon and one at bedtime). Vitamin B6 100mg  tablets. Take one tablet twice a day (up to 200 mg per day).  Skin Rashes: Aveeno products Benadryl cream or 25mg  every 6 hours as needed Calamine Lotion 1% cortisone cream  Yeast infection: Gyne-lotrimin 7 Monistat 7   **If taking multiple medications, please check labels to avoid duplicating the same active ingredients **take medication as directed on the label ** Do not exceed 4000 mg of tylenol in 24 hours **Do  not take medications that contain aspirin or ibuprofen

## 2020-07-23 NOTE — MAU Note (Signed)
Pt reports that she was having sex about two hours ago and stating have bloody show.  Pt also reports lower abdominal pain.

## 2020-08-21 ENCOUNTER — Telehealth (INDEPENDENT_AMBULATORY_CARE_PROVIDER_SITE_OTHER): Payer: BC Managed Care – PPO | Admitting: *Deleted

## 2020-08-21 DIAGNOSIS — Z3401 Encounter for supervision of normal first pregnancy, first trimester: Secondary | ICD-10-CM

## 2020-08-21 DIAGNOSIS — Z3A13 13 weeks gestation of pregnancy: Secondary | ICD-10-CM

## 2020-08-21 DIAGNOSIS — Z3400 Encounter for supervision of normal first pregnancy, unspecified trimester: Secondary | ICD-10-CM

## 2020-08-21 NOTE — Progress Notes (Signed)
  Virtual Visit via Telephone Note  I connected with Sheri Lawrence on 08/21/20 at  9:30 AM EDT by telephone and verified that I am speaking with the correct person using two identifiers.  Location: Patient: Home Provider: [facility]   I discussed the limitations, risks, security and privacy concerns of performing an evaluation and management service by telephone and the availability of in person appointments. I also discussed with the patient that there may be a patient responsible charge related to this service. The patient expressed understanding and agreed to proceed.   History of Present Illness: PRENATAL INTAKE SUMMARY  Sheri Lawrence presents today New OB Nurse Interview.  OB History    Gravida  1   Para      Term      Preterm      AB      Living        SAB      IAB      Ectopic      Multiple      Live Births             I have reviewed the patient's medical, obstetrical, social, and family histories, medications, and available lab results.  SUBJECTIVE She has no unusual complaints   Observations/Objective: Initial nurse interview for history/labs (New OB)  [employer]  EDD: 02/23/2021 by LMP   GA: [redacted]w[redacted]d G1P0 FHT: non face to face interview  GENERAL APPEARANCE: oriented to person, place and time  Assessment and Plan: Normal pregnancy Prenatal care: [place] to be completed at next visit with Sheri Lawrence, CNM on 4/6/202.   Follow Up Instructions:   I discussed the assessment and treatment plan with the patient. The patient was provided an opportunity to ask questions and all were answered. The patient agreed with the plan and demonstrated an understanding of the instructions.   The patient was advised to call back or seek an in-person evaluation if the symptoms worsen or if the condition fails to improve as anticipated.  I provided 30 minutes of non-face-to-face time during this encounter.   Derl Barrow, RN

## 2020-08-22 ENCOUNTER — Other Ambulatory Visit (INDEPENDENT_AMBULATORY_CARE_PROVIDER_SITE_OTHER): Payer: BC Managed Care – PPO | Admitting: *Deleted

## 2020-08-22 ENCOUNTER — Other Ambulatory Visit: Payer: Self-pay

## 2020-08-22 DIAGNOSIS — Z3400 Encounter for supervision of normal first pregnancy, unspecified trimester: Secondary | ICD-10-CM

## 2020-08-22 NOTE — Progress Notes (Signed)
   Patient in clinic for initial prenatal lab work. Patient declined genetic testing.  Derl Barrow, RN

## 2020-08-23 LAB — CBC/D/PLT+RPR+RH+ABO+RUB AB...
Antibody Screen: NEGATIVE
Basophils Absolute: 0 10*3/uL (ref 0.0–0.2)
Basos: 0 %
EOS (ABSOLUTE): 0.1 10*3/uL (ref 0.0–0.4)
Eos: 1 %
HCV Ab: <0.1 s/co ratio (ref 0.0–0.9)
HIV Screen 4th Generation wRfx: NONREACTIVE
Hematocrit: 36.5 % (ref 34.0–46.6)
Hemoglobin: 11.8 g/dL (ref 11.1–15.9)
Hepatitis B Surface Ag: NEGATIVE
Immature Grans (Abs): 0 10*3/uL (ref 0.0–0.1)
Immature Granulocytes: 1 %
Lymphocytes Absolute: 2.6 10*3/uL (ref 0.7–3.1)
Lymphs: 30 %
MCH: 27.2 pg (ref 26.6–33.0)
MCHC: 32.3 g/dL (ref 31.5–35.7)
MCV: 84 fL (ref 79–97)
Monocytes Absolute: 0.4 10*3/uL (ref 0.1–0.9)
Monocytes: 5 %
Neutrophils Absolute: 5.4 10*3/uL (ref 1.4–7.0)
Neutrophils: 63 %
Platelets: 297 10*3/uL (ref 150–450)
RBC: 4.34 x10E6/uL (ref 3.77–5.28)
RDW: 14.4 % (ref 11.7–15.4)
RPR Ser Ql: NONREACTIVE
Rh Factor: POSITIVE
Rubella Antibodies, IGG: 1.63 index (ref >0.99)
WBC: 8.5 10*3/uL (ref 3.4–10.8)

## 2020-08-23 LAB — HEMOGLOBIN A1C
Est. average glucose Bld gHb Est-mCnc: 103 mg/dL
Hgb A1c MFr Bld: 5.2 % (ref 4.8–5.6)

## 2020-08-23 LAB — HCV INTERPRETATION

## 2020-08-24 LAB — CULTURE, OB URINE

## 2020-08-24 LAB — URINE CULTURE, OB REFLEX

## 2020-08-29 ENCOUNTER — Other Ambulatory Visit (HOSPITAL_COMMUNITY)
Admission: RE | Admit: 2020-08-29 | Discharge: 2020-08-29 | Disposition: A | Discharge status: Home / Self Care | Payer: BC Managed Care – PPO | Source: Ambulatory Visit | Attending: Advanced Practice Midwife | Admitting: Advanced Practice Midwife

## 2020-08-29 ENCOUNTER — Ambulatory Visit (INDEPENDENT_AMBULATORY_CARE_PROVIDER_SITE_OTHER): Payer: BC Managed Care – PPO | Admitting: Advanced Practice Midwife

## 2020-08-29 ENCOUNTER — Encounter: Payer: Self-pay | Admitting: Advanced Practice Midwife

## 2020-08-29 ENCOUNTER — Other Ambulatory Visit: Payer: Self-pay

## 2020-08-29 VITALS — BP 111/73 | HR 91 | Temp 98.5°F | Wt 199.0 lb

## 2020-08-29 DIAGNOSIS — Z3400 Encounter for supervision of normal first pregnancy, unspecified trimester: Secondary | ICD-10-CM

## 2020-08-29 DIAGNOSIS — Z3A14 14 weeks gestation of pregnancy: Secondary | ICD-10-CM | POA: Diagnosis not present

## 2020-08-29 DIAGNOSIS — Z3402 Encounter for supervision of normal first pregnancy, second trimester: Secondary | ICD-10-CM | POA: Insufficient documentation

## 2020-08-29 NOTE — Progress Notes (Signed)
     Subjective:   Sheri Lawrence is a 25 y.o. G1P0 at [redacted]w[redacted]d by LMP/9 week [place] being seen today for her first obstetrical visit.    Gynecological/Obstetrical History: Her obstetrical history is significant for None. Patient unsure about her intent to breast feed. Pregnancy history fully reviewed. Patient reports no complaints. Sexual Activity and Vaginal Concerns  Medical History/ROS:   Social History: Patient denies current usage of tobacco, alcohol, or drugs.  Patient reports the FOB is Ladoris Gene who is involved.  Patient reports that she lives with FOB and endorses safety at home.  Patient denies DV/A. Patient is currently employed at [employer].  HISTORY: OB History  Gravida Para Term Preterm AB Living  1 0 0 0 0 0  SAB IAB Ectopic Multiple Live Births  0 0 0 0 0    # Outcome Date GA Lbr Len/2nd Weight Sex Delivery Anes PTL Lv  1 Current             Last pap smear was done approx one year ago in [place]. Patient reports it was normal. Will get records.   No past medical history on file. Past Surgical History:  Procedure Laterality Date  . TONSILLECTOMY     Family History  Family history unknown: Yes   Social History   Tobacco Use  . Smoking status: Never Smoker  . Smokeless tobacco: Never Used  Vaping Use  . Vaping Use: Never used  Substance Use Topics  . Alcohol use: Not Currently  . Drug use: Never   No Known Allergies Current Outpatient Medications on File Prior to Visit  Medication Sig Dispense Refill  . Prenatal Vit-Fe Fumarate-FA (PRENATAL PO) Take 2 tablets by mouth daily. Prenatal Gummies     No current facility-administered medications on file prior to visit.    Review of Systems Pertinent items noted in HPI and remainder of comprehensive ROS otherwise negative.  Exam   Vitals:   08/29/20 1007  BP: 111/73  Pulse: 91  Temp: 98.5 F (36.9 C)  Weight: 199 lb (90.3 kg)   Fetal Heart Rate (bpm): 147  Physical  Exam Constitutional:      General: She is not in acute distress. HENT:     Head: Normocephalic.  Cardiovascular:     Rate and Rhythm: Normal rate.  Pulmonary:     Effort: Pulmonary effort is normal.  Abdominal:     Palpations: Abdomen is soft.     Tenderness: There is no abdominal tenderness.  Neurological:     Mental Status: She is alert and oriented to person, place, and time.  Skin:    General: Skin is warm and dry.  Psychiatric:        Mood and Affect: Mood normal.        Behavior: Behavior normal.  Vitals and nursing note reviewed.     Assessment:   25 y.o. year old G1P0 Patient Active Problem List   Diagnosis Date Noted  . Supervision of normal first pregnancy, antepartum 08/21/2020     Plan:  1. Supervision of normal first pregnancy, antepartum - routine care  2. [redacted] weeks gestation of pregnancy - declines genetics    Problem list reviewed and updated. Routine obstetric precautions reviewed.  No orders of the defined types were placed in this encounter.   Return in about 4 weeks (around 09/26/2020).     Marcille Buffy DNP, CNM  08/29/20  10:22 AM

## 2020-08-31 LAB — URINE CYTOLOGY ANCILLARY ONLY
Chlamydia: NEGATIVE
Comment: NEGATIVE
Comment: NEGATIVE
Comment: NORMAL
Neisseria Gonorrhea: NEGATIVE
Trichomonas: NEGATIVE

## 2020-09-05 ENCOUNTER — Inpatient Hospital Stay (HOSPITAL_COMMUNITY)
Admission: AD | Admit: 2020-09-05 | Discharge: 2020-09-05 | Disposition: A | Discharge status: Home / Self Care | Payer: BC Managed Care – PPO | Attending: Obstetrics & Gynecology | Admitting: Obstetrics & Gynecology

## 2020-09-05 ENCOUNTER — Other Ambulatory Visit: Payer: Self-pay

## 2020-09-05 ENCOUNTER — Ambulatory Visit: Payer: BC Managed Care – PPO

## 2020-09-05 DIAGNOSIS — Z3A15 15 weeks gestation of pregnancy: Secondary | ICD-10-CM

## 2020-09-05 DIAGNOSIS — Z3400 Encounter for supervision of normal first pregnancy, unspecified trimester: Secondary | ICD-10-CM

## 2020-09-05 DIAGNOSIS — O26852 Spotting complicating pregnancy, second trimester: Secondary | ICD-10-CM | POA: Diagnosis not present

## 2020-09-05 LAB — URINALYSIS, ROUTINE W REFLEX MICROSCOPIC
Bacteria, UA: NONE SEEN
Bilirubin Urine: NEGATIVE
Glucose, UA: NEGATIVE mg/dL
Hgb urine dipstick: NEGATIVE
Ketones, ur: NEGATIVE mg/dL
Leukocytes,Ua: NEGATIVE
Nitrite: NEGATIVE
Protein, ur: 30 mg/dL — AB
Specific Gravity, Urine: 1.025 (ref 1.005–1.030)
pH: 7 (ref 5.0–8.0)

## 2020-09-05 NOTE — Discharge Instructions (Signed)
Vaginal Bleeding During Pregnancy, Second Trimester A small amount of bleeding from the vagina, or spotting, is common during early pregnancy. Some bleeding may be related to the pregnancy, and some may not. In many cases, the bleeding is normal and is not a problem. However, bleeding can also be a sign of something serious. Normal things may cause bleeding during the second trimester. They include:  Rapid changes in blood vessels. This is caused by changes that are happening to the body during pregnancy.  Sex.  Pelvic exams. Abnormal things that may cause bleeding during the second trimester include:  Infection, inflammation, or growths on the cervix. The growths on the cervix are also called polyps.  A condition in which the placenta partially or completely covers the opening of the cervix (placenta previa).  The placenta separating from the uterus (placenta abruption).  Miscarriage.  Early labor, also called preterm labor.  The cervix opening and thinning before pregnancy is at term and before labor starts (cervical insufficiency).  A mass of tissue developing in the uterus due to an egg being fertilized incorrectly (molar pregnancy). Tell your health care provider about any vaginal bleeding right away. Follow these instructions at home: Monitoring your bleeding Monitor your bleeding.  Pay attention to any changes in your symptoms. Let your health care provider know about any concerns.  Try to understand when the bleeding occurs. Does the bleeding start on its own, or does it start after something is done, such as sex or a pelvic exam?  Use a diary to record the things you see about your bleeding, including: ? The kind of bleeding you are having. Does the bleeding start and stop irregularly, or is it a constant flow? ? The severity of your bleeding. Is the bleeding heavy or light? ? The number of pads you use each day, how often you change them, and how soaked they are.  Tell  your health care provider if you pass tissue. He or she may want to see it.   Activity  Follow instructions from your health care provider about limiting your activity. Ask what activities are safe for you.  Do not exercise or do activities that take a lot of effort unless your health care provider approves.  Do not have sex until your health care provider says that this is safe.  Do not lift anything that is heavier than 10 lb (4.5 kg), or the limit that you are told, until your health care provider says that it is safe.  If needed, make plans for someone to help with your regular activities. Medicines  Take over-the-counter and prescription medicines only as told by your health care provider.  Do not take aspirin because it can cause bleeding. General instructions  Do not use tampons or douche.  Keep all follow-up visits. This is important. Contact a health care provider if:  You have vaginal bleeding during any time of your pregnancy.  You have cramps or labor pains.  You have a fever that does not get better when you take medicines. Get help right away if:  You have severe cramps in your back or abdomen.  You have contractions.  You have chills.  Your bleeding increases or you pass large clots or a large amount of tissue from your vagina.  You feel light-headed or weak, or you faint.  You are leaking fluid or have a gush of fluid from your vagina. Summary  A small amount of bleeding, or spotting, from the vagina is  common during early pregnancy.  Many things can cause vaginal bleeding during pregnancy. Tell your health care provider about any bleeding right away.  Try to understand when bleeding occurs. Does bleeding occur on its own, or does it occur after something is done, such as sex or pelvic exams?  Follow instructions from your health care provider about limiting your activity. Ask what activities are safe for you.  Keep all follow-up visits. This is  important. This information is not intended to replace advice given to you by your health care provider. Make sure you discuss any questions you have with your health care provider. Document Revised: 02/02/2020 Document Reviewed: 02/02/2020 Elsevier Patient Education  2021 [employer].

## 2020-09-05 NOTE — MAU Provider Note (Signed)
Chief Complaint: Vaginal Discharge   None     SUBJECTIVE HPI: Sheri Lawrence is a 25 y.o. G1P0 at [redacted]w[redacted]d who presents to maternity admissions reporting brown spotting x 1-2 days. There is no associated pain.  She denies any vaginal itching/burning or any bright red or pink bleeding.  There are no other associated symptoms.  She has not tried any treatments.    HPI  No past medical history on file. Past Surgical History:  Procedure Laterality Date  . TONSILLECTOMY     Social History   Socioeconomic History  . Marital status: Single    Spouse name: Not on file  . Number of children: Not on file  . Years of education: Not on file  . Highest education level: Bachelor's degree (e.g., BA, AB, BS)  Occupational History  . Not on file  Tobacco Use  . Smoking status: Never Smoker  . Smokeless tobacco: Never Used  Vaping Use  . Vaping Use: Never used  Substance and Sexual Activity  . Alcohol use: Not Currently  . Drug use: Never  . Sexual activity: Yes    Birth control/protection: None  Other Topics Concern  . Not on file  Social History Narrative  . Not on file   Social Determinants of Health   Financial Resource Strain: Not on file  Food Insecurity: Not on file  Transportation Needs: Not on file  Physical Activity: Not on file  Stress: Not on file  Social Connections: Not on file  Intimate Partner Violence: Not on file   No current facility-administered medications on file prior to encounter.   Current Outpatient Medications on File Prior to Encounter  Medication Sig Dispense Refill  . Prenatal Vit-Fe Fumarate-FA (PRENATAL PO) Take 2 tablets by mouth daily. Prenatal Gummies     No Known Allergies  ROS:  Review of Systems   I have reviewed patient's Past Medical Hx, Surgical Hx, Family Hx, Social Hx, medications and allergies.   Physical Exam   Patient Vitals for the past 24 hrs:  BP Temp Temp src Pulse Resp SpO2 Height Weight  09/05/20 1518 126/73 98 F  (36.7 C) Oral 95 18 97 % -- --  09/05/20 1513 -- -- -- -- -- -- 5\' 3"  (1.6 m) 89.9 kg   Constitutional: Well-developed, well-nourished female in no acute distress.  Cardiovascular: normal rate Respiratory: normal effort GI: Abd soft, non-tender. Pos BS x 4 MS: Extremities nontender, no edema, normal ROM Neurologic: Alert and oriented x 4.  GU: Neg CVAT.  Dilation: Closed Effacement (%): Thick Exam by:: L. Leftwich-kirby, CNM   FHT 156 by doppler  LAB RESULTS No results found for this or any previous visit (from the past 24 hour(s)).  A/Positive/-- (03/30 1059)  IMAGING No results found.  MAU Management/MDM: Orders Placed This Encounter  Procedures  . Wet prep, genital  . Urinalysis, Routine w reflex microscopic Urine, Clean Catch  . Discharge patient    No orders of the defined types were placed in this encounter.   FHR normal today by doppler. Cervix closed/long with no evidence of cervical shortening or dilation. Scant brown blood on glove following exam.  Wet prep/GCC pending.  D/C home with bleeding precautions. F/U in office as scheduled, return to MAU as needed for emergencies.    ASSESSMENT 1. Spotting affecting pregnancy in second trimester   2. Supervision of normal first pregnancy, antepartum   3. [redacted] weeks gestation of pregnancy     PLAN Discharge home Allergies as of  09/05/2020   No Known Allergies     Medication List    TAKE these medications   PRENATAL PO Take 2 tablets by mouth daily. Prenatal Gummies       Follow-up Information    [facility] Follow up.   Specialty: Obstetrics and Gynecology Why: As scheduled, return to MAU as needed for worsening symptoms Contact information: [place] [phone]             Addendum:  Lab called to report that there are no cells on the wet prep sample, no epithelial cells at all so pt credited and lab not resulted.  I called pt  to report that wet prep will not have result today. She is to call [facility] and scheduled vaginal swab if symptoms persist.    Fatima Blank Certified Nurse-Midwife 09/05/2020  3:59 PM

## 2020-09-05 NOTE — MAU Note (Signed)
Presents with c/o brown, coffee ground, vaginal discharge that began approx 1 hour ago.  Denies abdominal pain/cramping.  Denies bright red VB.

## 2020-09-06 ENCOUNTER — Other Ambulatory Visit (HOSPITAL_COMMUNITY)
Admission: RE | Admit: 2020-09-06 | Discharge: 2020-09-06 | Disposition: A | Discharge status: Home / Self Care | Payer: BC Managed Care – PPO | Source: Ambulatory Visit | Attending: Obstetrics and Gynecology | Admitting: Obstetrics and Gynecology

## 2020-09-06 ENCOUNTER — Ambulatory Visit (INDEPENDENT_AMBULATORY_CARE_PROVIDER_SITE_OTHER): Payer: BC Managed Care – PPO | Admitting: *Deleted

## 2020-09-06 VITALS — BP 126/73 | HR 95 | Temp 98.0°F | Ht 63.0 in | Wt 198.0 lb

## 2020-09-06 DIAGNOSIS — N939 Abnormal uterine and vaginal bleeding, unspecified: Secondary | ICD-10-CM | POA: Diagnosis not present

## 2020-09-06 DIAGNOSIS — Z3400 Encounter for supervision of normal first pregnancy, unspecified trimester: Secondary | ICD-10-CM | POA: Diagnosis present

## 2020-09-06 LAB — GC/CHLAMYDIA PROBE AMP (~~LOC~~) NOT AT ARMC
Chlamydia: NEGATIVE
Comment: NEGATIVE
Comment: NORMAL
Neisseria Gonorrhea: NEGATIVE

## 2020-09-06 NOTE — Progress Notes (Signed)
   Patient in clinic this AM for repeat vaginal self-swab. Patient was seen at MAU yesterday 09/05/20 for brown spotting x 1-2 days. Denies vaginal itching, burning, bright red bleeding and pain. Patient was told to call clinic for an appointment because self swab completed yesterday did not have any cells to report for BV or yeast.   Derl Barrow, RN

## 2020-09-07 LAB — CERVICOVAGINAL ANCILLARY ONLY
Bacterial Vaginitis (gardnerella): NEGATIVE
Candida Glabrata: NEGATIVE
Candida Vaginitis: NEGATIVE
Chlamydia: NEGATIVE
Comment: NEGATIVE
Comment: NEGATIVE
Comment: NEGATIVE
Comment: NEGATIVE
Comment: NEGATIVE
Comment: NORMAL
Neisseria Gonorrhea: NEGATIVE
Trichomonas: NEGATIVE

## 2020-09-12 ENCOUNTER — Telehealth: Payer: Self-pay | Admitting: *Deleted

## 2020-09-12 NOTE — Telephone Encounter (Signed)
Received message from 09/11/20 triage nurse line stating patient's blood pressure was 120/98, no symptoms. Called patient, verified DOB. Patient reported she is feeling fine today, no symptoms. Currently at work, nurse asked patient to check blood pressure when she get and send a [employer].   Derl Barrow, RN

## 2020-09-26 ENCOUNTER — Other Ambulatory Visit: Payer: Self-pay

## 2020-09-26 ENCOUNTER — Ambulatory Visit (INDEPENDENT_AMBULATORY_CARE_PROVIDER_SITE_OTHER): Payer: BC Managed Care – PPO | Admitting: Obstetrics and Gynecology

## 2020-09-26 VITALS — BP 101/70 | HR 96 | Temp 98.0°F | Wt 197.2 lb

## 2020-09-26 DIAGNOSIS — R519 Headache, unspecified: Secondary | ICD-10-CM

## 2020-09-26 DIAGNOSIS — Z3A18 18 weeks gestation of pregnancy: Secondary | ICD-10-CM

## 2020-09-26 DIAGNOSIS — Z3400 Encounter for supervision of normal first pregnancy, unspecified trimester: Secondary | ICD-10-CM

## 2020-09-26 NOTE — Progress Notes (Signed)
   PRENATAL VISIT NOTE  Subjective:  Sheri Lawrence is a 25 y.o. G1P0 at [redacted]w[redacted]d being seen today for ongoing prenatal care.  She is currently monitored for the following issues for this low-risk pregnancy and has Supervision of normal first pregnancy, antepartum on their problem list.  Patient reports headache.  Contractions: Not present. Vag. Bleeding: None.  Movement: Absent. Denies leaking of fluid.   Reports [facility] scripts showed some elevated BP's, normal BP's in the office.   The following portions of the patient's history were reviewed and updated as appropriate: allergies, current medications, past family history, past medical history, past social history, past surgical history and problem list.   Objective:   Vitals:   09/26/20 1023  BP: 101/70  Pulse: 96  Temp: 98 F (36.7 C)  Weight: 197 lb 3.2 oz (89.4 kg)    Fetal Status: Fetal Heart Rate (bpm): 153   Movement: Absent     General:  Alert, oriented and cooperative. Patient is in no acute distress.  Skin: Skin is warm and dry. No rash noted.   Cardiovascular: Normal heart rate noted  Respiratory: Normal respiratory effort, no problems with respiration noted  Abdomen: Soft, gravid, appropriate for gestational age.  Pain/Pressure: Absent     Pelvic: Cervical exam deferred        Extremities: Normal range of motion.  Edema: None  Mental Status: Normal mood and affect. Normal behavior. Normal judgment and thought content.   Assessment and Plan:  Pregnancy: G1P0 at [redacted]w[redacted]d  1. Generalized headaches  - Protein / creatinine ratio, urine - CBC - Comprehensive metabolic panel - discussed tylenol OTC- ok to use extra strength tylenol  - continue to monitor BP at home.   2. Supervision of normal first pregnancy, antepartum  - Protein / creatinine ratio, urine - CBC - Comprehensive metabolic panel  Pap results not in OB records. Patient thinks she had a normal pap 2 years ago, but she is unsure. Will add pap to 3rd  trimester labs.    Preterm labor symptoms and general obstetric precautions including but not limited to vaginal bleeding, contractions, leaking of fluid and fetal movement were reviewed in detail with the patient. Please refer to After Visit Summary for other counseling recommendations.   No follow-ups on file.  Future Appointments  Date Time Provider [place]  10/02/2020 10:30 AM [facility] US3 [facility]  10/11/2020 10:10 AM Laury Deep, CNM [facility] None  11/08/2020 10:10 AM Laury Deep, CNM [facility] None  12/06/2020  8:10 AM Gavin Pound, CNM [facility] None    Noni Saupe, NP

## 2020-09-27 LAB — COMPREHENSIVE METABOLIC PANEL
ALT: 8 IU/L (ref 0–32)
AST: 13 IU/L (ref 0–40)
Albumin/Globulin Ratio: 1.2 (ref 1.2–2.2)
Albumin: 3.8 g/dL — ABNORMAL LOW (ref 3.9–5.0)
Alkaline Phosphatase: 44 IU/L (ref 44–121)
BUN/Creatinine Ratio: 12 (ref 9–23)
BUN: 6 mg/dL (ref 6–20)
Bilirubin Total: <0.2 mg/dL (ref 0.0–1.2)
CO2: 19 mmol/L — ABNORMAL LOW (ref 20–29)
Calcium: 9.2 mg/dL (ref 8.7–10.2)
Chloride: 99 mmol/L (ref 96–106)
Creatinine, Ser: 0.5 mg/dL — ABNORMAL LOW (ref 0.57–1.00)
Globulin, Total: 3.1 g/dL (ref 1.5–4.5)
Glucose: 120 mg/dL — ABNORMAL HIGH (ref 65–99)
Potassium: 3.9 mmol/L (ref 3.5–5.2)
Sodium: 135 mmol/L (ref 134–144)
Total Protein: 6.9 g/dL (ref 6.0–8.5)
eGFR: 135 mL/min/{1.73_m2} (ref >59)

## 2020-09-27 LAB — CBC
Hematocrit: 34.3 % (ref 34.0–46.6)
Hemoglobin: 11.3 g/dL (ref 11.1–15.9)
MCH: 27.5 pg (ref 26.6–33.0)
MCHC: 32.9 g/dL (ref 31.5–35.7)
MCV: 84 fL (ref 79–97)
Platelets: 283 10*3/uL (ref 150–450)
RBC: 4.11 x10E6/uL (ref 3.77–5.28)
RDW: 14.1 % (ref 11.7–15.4)
WBC: 8.7 10*3/uL (ref 3.4–10.8)

## 2020-09-27 LAB — PROTEIN / CREATININE RATIO, URINE
Creatinine, Urine: 217.6 mg/dL
Protein, Ur: 44.8 mg/dL
Protein/Creat Ratio: 206 mg/g creat — ABNORMAL HIGH (ref 0–200)

## 2020-10-02 ENCOUNTER — Ambulatory Visit: Payer: BC Managed Care – PPO

## 2020-10-09 ENCOUNTER — Telehealth: Payer: Self-pay

## 2020-10-09 NOTE — Telephone Encounter (Signed)
Mar/sw patient and advised of appointment time change-arrive@[id number]  on 10/12/2020.

## 2020-10-11 ENCOUNTER — Telehealth (INDEPENDENT_AMBULATORY_CARE_PROVIDER_SITE_OTHER): Payer: BC Managed Care – PPO | Admitting: Obstetrics and Gynecology

## 2020-10-11 ENCOUNTER — Encounter: Payer: Self-pay | Admitting: Obstetrics and Gynecology

## 2020-10-11 VITALS — BP 121/70 | HR 90

## 2020-10-11 DIAGNOSIS — Z3402 Encounter for supervision of normal first pregnancy, second trimester: Secondary | ICD-10-CM

## 2020-10-11 DIAGNOSIS — Z3400 Encounter for supervision of normal first pregnancy, unspecified trimester: Secondary | ICD-10-CM

## 2020-10-11 DIAGNOSIS — Z3A20 20 weeks gestation of pregnancy: Secondary | ICD-10-CM

## 2020-10-11 NOTE — Progress Notes (Signed)
   TELEPHONE VIRTUAL OBSTETRICS VISIT ENCOUNTER NOTE  I connected with Sheri Lawrence on 10/11/20 at 10:10 AM EDT by telephone (due to unable to connect through [place] on CNM side) at home and verified that I am speaking with the correct person using two identifiers. Provider located at [employer] for [employer] [facility] at [place].   I discussed the limitations, risks, security and privacy concerns of performing an evaluation and management service by telephone and the availability of in person appointments. I also discussed with the patient that there may be a patient responsible charge related to this service. The patient expressed understanding and agreed to proceed.  Subjective:  Sheri Lawrence is a 25 y.o. G1P0 at [redacted]w[redacted]d being followed for ongoing prenatal care.  She is currently monitored for the following issues for this low-risk pregnancy and has Supervision of normal first pregnancy, antepartum on their problem list.  Patient reports no complaints. Desires waterbirth and wants to know what to do. Reports fetal movement. Denies any contractions, bleeding or leaking of fluid.   The following portions of the patient's history were reviewed and updated as appropriate: allergies, current medications, past family history, past medical history, past social history, past surgical history and problem list.   Objective:   General:  Alert, oriented and cooperative.   Mental Status: Normal mood and affect perceived. Normal judgment and thought content.  Rest of physical exam deferred due to type of encounter  BP 121/70   Pulse 90   LMP 05/19/2020  **Done by patient's own at home BP cuff **Patient does not own a scale Assessment and Plan:  Pregnancy: G1P0 at [redacted]w[redacted]d  1. Supervision of normal first pregnancy, antepartum - Anatomy U/S scheduled for 10/12/2020 - Discussed waterbirth process to qualify as candidate - Information provided on waterbirth    2. [redacted] weeks gestation of pregnancy     Preterm labor symptoms and general obstetric precautions including but not limited to vaginal bleeding, contractions, leaking of fluid and fetal movement were reviewed in detail with the patient.  I discussed the assessment and treatment plan with the patient. The patient was provided an opportunity to ask questions and all were answered. The patient agreed with the plan and demonstrated an understanding of the instructions. The patient was advised to call back or seek an in-person office evaluation/go to MAU at [facility] for any urgent or concerning symptoms. Please refer to After Visit Summary for other counseling recommendations.   I provided 10 minutes of non-face-to-face time during this encounter. There was 5 minutes of chart review time spent prior to this encounter. Total time spent = 15 minutes.  Return in about 4 weeks (around 11/08/2020) for Return OB - My Chart video.  Future Appointments  Date Time Provider [place]  10/12/2020  8:45 AM [facility] US6 [facility]  11/08/2020 10:10 AM Laury Deep, CNM [facility] None  12/06/2020  8:10 AM Gavin Pound, CNM [facility] None    Laury Deep, [place] for [employer], [place]

## 2020-10-11 NOTE — Patient Instructions (Signed)
Considering Waterbirth? Guide for patients at [facility]) Why consider waterbirth? . Gentle birth for babies  . Less pain medicine used in labor  . May allow for passive descent/less pushing  . May reduce perineal tears  . More mobility and instinctive maternal position changes  . Increased maternal relaxation   Is waterbirth safe? What are the risks of infection, drowning or other complications? . Infection:  Marland Kitchen Very low risk (3.7 % for tub vs 4.8% for bed)  . 7 in 10 waterbirths with documented infection  . Poorly cleaned equipment most common cause  . Slightly lower group B strep transmission rate  . Drowning  . Maternal:  . Very low risk  . Related to seizures or fainting  . Newborn:  Marland Kitchen Very low risk. No evidence of increased risk of respiratory problems in multiple large studies  . Physiological protection from breathing under water  . Avoid underwater birth if there are any fetal complications  . Once baby's head is out of the water, keep it out.  . Birth complication  . Some reports of cord trauma, but risk decreased by bringing baby to surface gradually  . No evidence of increased risk of shoulder dystocia. Mothers can usually change positions faster in water than in a bed, possibly aiding the maneuvers to free the shoulder.   There are 2 things you MUST do to have a waterbirth with [facility]: 1. Attend a waterbirth class at [place] at [facility]   a. 3rd Wednesday of every month from 7-9 pm (virtual during [place]) b. Free c. Register online at [email or web address] or [email or web address] or by calling [phone] d. Bring [place] the certificate from the class to your prenatal appointment or send via MyChart 2. Meet with a midwife at 36 weeks* to see if you can still plan a waterbirth and to sign the consent.   *We also recommend that you schedule as many of your prenatal visits with a midwife as possible.    Helpful  information: . You may want to bring a bathing suit top to the hospital to wear during labor but this is optional.  All other supplies are provided by the hospital. . Please arrive at the hospital with signs of active labor, and do not wait at home until late in labor. It takes 45 min- 2 hours for COVID testing, fetal monitoring, and check in to your room to take place, plus transport and filling of the waterbirth tub.    Things that would prevent you from having a waterbirth: . Unknown or Positive COVID-19 diagnosis upon admission to hospital* . Premature, <37wks  . Previous cesarean birth  . Presence of thick meconium-stained fluid  . Multiple gestation (Twins, triplets, etc.)  . Uncontrolled diabetes or gestational diabetes requiring medication  . Hypertension diagnosed in pregnancy or preexisting hypertension (gestational hypertension, preeclampsia, or chronic hypertension) . Fetal growth restriction (your baby measures less than 10th percentile on ultrasound) . Heavy vaginal bleeding  . Non-reassuring fetal heart rate  . Active infection (MRSA, etc.). Group B Strep is NOT a contraindication for waterbirth.  . If your labor has to be induced and induction method requires continuous monitoring of the baby's heart rate  . Other risks/issues identified by your obstetrical provider   Please remember that birth is unpredictable. Under certain unforeseeable circumstances your provider may advise against giving birth in the tub. These decisions will be made on a case-by-case basis and with the safety of you  and your baby as our highest priority.   *Please remember that in order to have a waterbirth, you must test Negative to COVID-19 upon admission to the hospital.  Updated 09/03/20  

## 2020-10-12 ENCOUNTER — Ambulatory Visit: Payer: BC Managed Care – PPO

## 2020-10-12 ENCOUNTER — Other Ambulatory Visit: Payer: Self-pay

## 2020-10-12 ENCOUNTER — Other Ambulatory Visit: Payer: Self-pay | Admitting: Advanced Practice Midwife

## 2020-10-12 ENCOUNTER — Other Ambulatory Visit: Payer: Self-pay | Admitting: Obstetrics

## 2020-10-12 ENCOUNTER — Ambulatory Visit: Payer: BC Managed Care – PPO | Attending: Advanced Practice Midwife

## 2020-10-12 DIAGNOSIS — Z3689 Encounter for other specified antenatal screening: Secondary | ICD-10-CM | POA: Insufficient documentation

## 2020-10-12 DIAGNOSIS — O358XX0 Maternal care for other (suspected) fetal abnormality and damage, not applicable or unspecified: Secondary | ICD-10-CM

## 2020-10-12 DIAGNOSIS — Z3A20 20 weeks gestation of pregnancy: Secondary | ICD-10-CM | POA: Insufficient documentation

## 2020-10-12 DIAGNOSIS — Z3A14 14 weeks gestation of pregnancy: Secondary | ICD-10-CM | POA: Insufficient documentation

## 2020-10-12 DIAGNOSIS — Z3400 Encounter for supervision of normal first pregnancy, unspecified trimester: Secondary | ICD-10-CM | POA: Insufficient documentation

## 2020-10-12 DIAGNOSIS — IMO0001 Reserved for inherently not codable concepts without codable children: Secondary | ICD-10-CM

## 2020-11-08 ENCOUNTER — Telehealth (INDEPENDENT_AMBULATORY_CARE_PROVIDER_SITE_OTHER): Payer: BC Managed Care – PPO | Admitting: Obstetrics and Gynecology

## 2020-11-08 ENCOUNTER — Encounter: Payer: Self-pay | Admitting: Obstetrics and Gynecology

## 2020-11-08 ENCOUNTER — Other Ambulatory Visit: Payer: Self-pay

## 2020-11-08 VITALS — BP 114/72

## 2020-11-08 DIAGNOSIS — Z3402 Encounter for supervision of normal first pregnancy, second trimester: Secondary | ICD-10-CM

## 2020-11-08 DIAGNOSIS — Z3400 Encounter for supervision of normal first pregnancy, unspecified trimester: Secondary | ICD-10-CM

## 2020-11-08 DIAGNOSIS — Z3A24 24 weeks gestation of pregnancy: Secondary | ICD-10-CM

## 2020-11-08 NOTE — Patient Instructions (Signed)
Oral Glucose Tolerance Test During Pregnancy Why am I having this test? The oral glucose tolerance test (OGTT) is done to check how your body processes blood sugar (glucose). This is one of several tests used to diagnose diabetes that develops during pregnancy (gestational diabetes mellitus). Gestational diabetes is a short-term form of diabetes that some women develop while they are pregnant. It usually occurs during the second trimesterof pregnancy and goes away after delivery. Testing, or screening, for gestational diabetes usually occurs at weeks 24-28 of pregnancy. You may have the OGTT test after having a 1-hour glucose screening test if the results from that test indicate that you may have gestational diabetes. This test may also be needed if: You have a history of gestational diabetes. There is a history of giving birth to very large babies or of losing pregnancies (having stillbirths). You have signs and symptoms of diabetes, such as: Changes in your eyesight. Tingling or numbness in your hands or feet. Changes in hunger, thirst, and urination, and these are not explained by your pregnancy. What is being tested? This test measures the amount of glucose in your blood at different timesduring a period of 3 hours. This shows how well your body can process glucose. What kind of sample is taken?  Blood samples are required for this test. They are usually collected byinserting a needle into a blood vessel. How do I prepare for this test? For 3 days before your test, eat normally. Have plenty of carbohydrate-rich foods. Follow instructions from your health care provider about: Eating or drinking restrictions on the day of the test. You may be asked not to eat or drink anything other than water (to fast) starting 8-10 hours before the test. Changing or stopping your regular medicines. Some medicines may interfere with this test. Tell a health care provider about: All medicines you are taking,  including vitamins, herbs, eye drops, creams, and over-the-counter medicines. Any blood disorders you have. Any surgeries you have had. Any medical conditions you have. What happens during the test? First, your blood glucose will be measured. This is referred to as your fasting blood glucose because you fasted before the test. Then, you will drink a glucose solution that contains a certain amount of glucose. Your blood glucosewill be measured again 1, 2, and 3 hours after you drink the solution. This test takes about 3 hours to complete. You will need to stay at the testing location during this time. During the testing period: Do not eat or drink anything other than the glucose solution. Do not exercise. Do not use any products that contain nicotine or tobacco, such as cigarettes, e-cigarettes, and chewing tobacco. These can affect your test results. If you need help quitting, ask your health care provider. The testing procedure may vary among health care providers and hospitals. How are the results reported? Your results will be reported as milligrams of glucose per deciliter of blood (mg/dL) or millimoles per liter (mmol/L). There is more than one source for screening and diagnosis reference values used to diagnose gestational diabetes. Your health care provider will compare your results to normal values that were established after testing a large group of people (reference values). Reference values may vary among labs and hospitals. For this test (Carpenter-Coustan), reference values are: Fasting: 95 mg/dL (5.3 mmol/L). 1 hour: 180 mg/dL (10.0 mmol/L). 2 hour: 155 mg/dL (8.6 mmol/L). 3 hour: 140 mg/dL (7.8 mmol/L). What do the results mean? Results below the reference values are considered normal. If two  or more of your blood glucose levels are at or above the reference values, you may be diagnosed with gestational diabetes. If only one level is high, your healthcare provider may suggest repeat  testing or other tests to confirm a diagnosis. Talk with your health care provider about what your results mean. Questions to ask your health care provider Ask your health care provider, or the department that is doing the test: When will my results be ready? How will I get my results? What are my treatment options? What other tests do I need? What are my next steps? Summary The oral glucose tolerance test (OGTT) is one of several tests used to diagnose diabetes that develops during pregnancy (gestational diabetes mellitus). Gestational diabetes is a short-term form of diabetes that some women develop while they are pregnant. You may have the OGTT test after having a 1-hour glucose screening test if the results from that test show that you may have gestational diabetes. You may also have this test if you have any symptoms or risk factors for this type of diabetes. Talk with your health care provider about what your results mean. This information is not intended to replace advice given to you by your health care provider. Make sure you discuss any questions you have with your healthcare provider. Document Revised: 10/20/2019 Document Reviewed: 10/20/2019 Elsevier Patient Education  2022 [employer].

## 2020-11-08 NOTE — Progress Notes (Signed)
   MY CHART VIDEO VIRTUAL OBSTETRICS VISIT ENCOUNTER NOTE  I connected with Sheri Lawrence on 11/08/20 at 10:10 AM EDT by My Chart video at home and verified that I am speaking with the correct person using two identifiers. Provider located at [employer] for [employer] [facility] at [place].   I discussed the limitations, risks, security and privacy concerns of performing an evaluation and management service by My Chart video and the availability of in person appointments. I also discussed with the patient that there may be a patient responsible charge related to this service. The patient expressed understanding and agreed to proceed.  I discussed the limitations of telemedicine and the availability of in person appointments.  Discussed there is a possibility of technology failure and discussed alternative modes of communication if that failure occurs.  Subjective:  Sheri Lawrence is a 25 y.o. G1P0 at [redacted]w[redacted]d being followed for ongoing prenatal care.  She is currently monitored for the following issues for this low-risk pregnancy and has Supervision of normal first pregnancy, antepartum on their problem list.  Patient reports no complaints. Reports fetal movement. Denies any contractions, bleeding or leaking of fluid.   The following portions of the patient's history were reviewed and updated as appropriate: allergies, current medications, past family history, past medical history, past social history, past surgical history and problem list.   Objective:   General:  Alert, oriented and cooperative.   Mental Status: Normal mood and affect perceived. Normal judgment and thought content.  Rest of physical exam deferred due to type of encounter  BP 114/72   LMP 05/19/2020  **Done by patient's own at home BP cuff and scale  Assessment and Plan:  Pregnancy: G1P0 at [redacted]w[redacted]d  1. Supervision of normal first pregnancy, antepartum - Anticipatory guidance for 2 hr GTT - advised to fast after midnight  without anything to eat or drink (except for water), will have fasting blood drawn, drink the glucola drink (flavor choices: orange, fruit punch or lemon-lime), have a visit with a provider during the first hour of testing, wait in the lab waiting room to have blood drawn at 1 hour and then 2 hours after finishing glucola drink. - Attended H2O birth class 2 wks ago, will bring certificate by the office or send via MyChart  2. [redacted] weeks gestation of pregnancy    Preterm labor symptoms and general obstetric precautions including but not limited to vaginal bleeding, contractions, leaking of fluid and fetal movement were reviewed in detail with the patient.  I discussed the assessment and treatment plan with the patient. The patient was provided an opportunity to ask questions and all were answered. The patient agreed with the plan and demonstrated an understanding of the instructions. The patient was advised to call back or seek an in-person office evaluation/go to MAU at [facility] for any urgent or concerning symptoms. Please refer to After Visit Summary for other counseling recommendations.   I provided 5 minutes of non-face-to-face time during this encounter. There was 5 minutes of chart review time spent prior to this encounter. Total time spent = 10 minutes.  Return in about 4 weeks (around 12/06/2020) for Return OB 2hr GTT.  Future Appointments  Date Time Provider [place]  11/12/2020 10:30 AM [facility] NURSE [facility]  11/12/2020 10:45 AM [facility]  12/06/2020  8:10 AM Gavin Pound, CNM [facility] None    Laury Deep, [place] for [employer], [place] [facility]

## 2020-11-12 ENCOUNTER — Encounter: Payer: Self-pay | Admitting: *Deleted

## 2020-11-12 ENCOUNTER — Ambulatory Visit: Payer: BC Managed Care – PPO | Attending: Obstetrics

## 2020-11-12 ENCOUNTER — Other Ambulatory Visit: Payer: Self-pay | Admitting: *Deleted

## 2020-11-12 ENCOUNTER — Other Ambulatory Visit: Payer: Self-pay

## 2020-11-12 ENCOUNTER — Ambulatory Visit: Payer: BC Managed Care – PPO | Admitting: *Deleted

## 2020-11-12 VITALS — BP 115/56 | HR 82

## 2020-11-12 DIAGNOSIS — O3412 Maternal care for benign tumor of corpus uteri, second trimester: Secondary | ICD-10-CM

## 2020-11-12 DIAGNOSIS — O358XX0 Maternal care for other (suspected) fetal abnormality and damage, not applicable or unspecified: Secondary | ICD-10-CM | POA: Diagnosis not present

## 2020-11-12 DIAGNOSIS — Z3A25 25 weeks gestation of pregnancy: Secondary | ICD-10-CM

## 2020-11-12 DIAGNOSIS — Z3400 Encounter for supervision of normal first pregnancy, unspecified trimester: Secondary | ICD-10-CM | POA: Insufficient documentation

## 2020-11-12 DIAGNOSIS — O283 Abnormal ultrasonic finding on antenatal screening of mother: Secondary | ICD-10-CM

## 2020-11-12 DIAGNOSIS — Z364 Encounter for antenatal screening for fetal growth retardation: Secondary | ICD-10-CM

## 2020-11-12 DIAGNOSIS — D259 Leiomyoma of uterus, unspecified: Secondary | ICD-10-CM

## 2020-11-12 DIAGNOSIS — IMO0001 Reserved for inherently not codable concepts without codable children: Secondary | ICD-10-CM

## 2020-11-17 ENCOUNTER — Other Ambulatory Visit: Payer: Self-pay

## 2020-11-17 ENCOUNTER — Inpatient Hospital Stay (EMERGENCY_DEPARTMENT_HOSPITAL)
Admission: AD | Admit: 2020-11-17 | Discharge: 2020-11-17 | Disposition: A | Discharge status: Home / Self Care | Payer: BC Managed Care – PPO | Source: Home / Self Care | Attending: Obstetrics & Gynecology | Admitting: Obstetrics & Gynecology

## 2020-11-17 ENCOUNTER — Encounter (HOSPITAL_COMMUNITY): Payer: Self-pay | Admitting: Obstetrics & Gynecology

## 2020-11-17 ENCOUNTER — Inpatient Hospital Stay (HOSPITAL_COMMUNITY)
Admission: AD | Admit: 2020-11-17 | Discharge: 2020-11-17 | Disposition: A | Discharge status: Home / Self Care | Payer: BC Managed Care – PPO | Attending: Obstetrics & Gynecology | Admitting: Obstetrics & Gynecology

## 2020-11-17 DIAGNOSIS — R110 Nausea: Secondary | ICD-10-CM | POA: Insufficient documentation

## 2020-11-17 DIAGNOSIS — R109 Unspecified abdominal pain: Secondary | ICD-10-CM | POA: Insufficient documentation

## 2020-11-17 DIAGNOSIS — O26892 Other specified pregnancy related conditions, second trimester: Secondary | ICD-10-CM | POA: Insufficient documentation

## 2020-11-17 DIAGNOSIS — Z3A26 26 weeks gestation of pregnancy: Secondary | ICD-10-CM | POA: Insufficient documentation

## 2020-11-17 DIAGNOSIS — R519 Headache, unspecified: Secondary | ICD-10-CM | POA: Insufficient documentation

## 2020-11-17 DIAGNOSIS — Z3689 Encounter for other specified antenatal screening: Secondary | ICD-10-CM | POA: Insufficient documentation

## 2020-11-17 DIAGNOSIS — O4700 False labor before 37 completed weeks of gestation, unspecified trimester: Secondary | ICD-10-CM

## 2020-11-17 DIAGNOSIS — M549 Dorsalgia, unspecified: Secondary | ICD-10-CM

## 2020-11-17 DIAGNOSIS — O99891 Other specified diseases and conditions complicating pregnancy: Secondary | ICD-10-CM | POA: Insufficient documentation

## 2020-11-17 DIAGNOSIS — O4702 False labor before 37 completed weeks of gestation, second trimester: Secondary | ICD-10-CM | POA: Diagnosis not present

## 2020-11-17 DIAGNOSIS — Z3400 Encounter for supervision of normal first pregnancy, unspecified trimester: Secondary | ICD-10-CM

## 2020-11-17 LAB — WET PREP, GENITAL
Clue Cells Wet Prep HPF POC: NONE SEEN
Sperm: NONE SEEN
Trich, Wet Prep: NONE SEEN
Yeast Wet Prep HPF POC: NONE SEEN

## 2020-11-17 LAB — URINALYSIS, ROUTINE W REFLEX MICROSCOPIC
Bacteria, UA: NONE SEEN
Bilirubin Urine: NEGATIVE
Bilirubin Urine: NEGATIVE
Glucose, UA: NEGATIVE mg/dL
Glucose, UA: NEGATIVE mg/dL
Hgb urine dipstick: NEGATIVE
Ketones, ur: NEGATIVE mg/dL
Ketones, ur: 5 mg/dL — AB
Leukocytes,Ua: NEGATIVE
Leukocytes,Ua: NEGATIVE
Nitrite: NEGATIVE
Nitrite: NEGATIVE
Protein, ur: NEGATIVE mg/dL
Protein, ur: NEGATIVE mg/dL
RBC / HPF: >50 RBC/hpf — ABNORMAL HIGH (ref 0–5)
Specific Gravity, Urine: 1.017 (ref 1.005–1.030)
Specific Gravity, Urine: 1.014 (ref 1.005–1.030)
pH: 7 (ref 5.0–8.0)
pH: 9 — ABNORMAL HIGH (ref 5.0–8.0)

## 2020-11-17 MED ORDER — ONDANSETRON 4 MG PO TBDP: 4.0000 mg | ORAL_TABLET | Freq: Four times a day (QID) | ORAL | 0 refills | Status: DC | PRN

## 2020-11-17 MED ORDER — CYCLOBENZAPRINE HCL 5 MG PO TABS
10.0000 mg | ORAL_TABLET | Freq: Once | ORAL | Status: AC
  Administered 2020-11-17: 22:00:00 10 mg via ORAL
  Filled 2020-11-17: qty 2

## 2020-11-17 MED ORDER — LACTATED RINGERS IV BOLUS
1000.0000 mL | Freq: Once | INTRAVENOUS | Status: AC
  Administered 2020-11-17: 12:00:00 1000 mL via INTRAVENOUS

## 2020-11-17 MED ORDER — NIFEDIPINE 10 MG PO CAPS
10.0000 mg | ORAL_CAPSULE | ORAL | Status: AC | PRN
  Administered 2020-11-17 (×3): 10 mg via ORAL
  Filled 2020-11-17 (×3): qty 1

## 2020-11-17 MED ORDER — ACETAMINOPHEN 500 MG PO TABS
1000.0000 mg | ORAL_TABLET | Freq: Once | ORAL | Status: AC
  Administered 2020-11-17: 22:00:00 1000 mg via ORAL
  Filled 2020-11-17: qty 2

## 2020-11-17 MED ORDER — ONDANSETRON 4 MG PO TBDP
4.0000 mg | ORAL_TABLET | Freq: Once | ORAL | Status: AC
  Administered 2020-11-17: 23:00:00 4 mg via ORAL
  Filled 2020-11-17: qty 1

## 2020-11-17 NOTE — Discharge Instructions (Signed)

## 2020-11-17 NOTE — MAU Provider Note (Signed)
History     CSN: [id number]  Arrival date and time: 11/17/20 1102   Event Date/Time   First Provider Initiated Contact with Patient 11/17/20 1132      Chief Complaint  Patient presents with   Back Pain   Abdominal Pain   HPI Sheri Lawrence is a 25 y.o. G1P0 at [redacted]w[redacted]d who presents with pressure in her abdomen. She states the pressure comes and goes and is not painful. She feels like it might be pressure in her bladder. She states the pressure sometimes radiates to her back. She denies any pain. She denies any vaginal discharge or bleeding. Reports normal fetal movement.   OB History     Gravida  1   Para      Term      Preterm      AB      Living         SAB      IAB      Ectopic      Multiple      Live Births              History reviewed. No pertinent past medical history.  Past Surgical History:  Procedure Laterality Date   TONSILLECTOMY      Family History  Family history unknown: Yes    Social History   Tobacco Use   Smoking status: Never   Smokeless tobacco: Never  Vaping Use   Vaping Use: Never used  Substance Use Topics   Alcohol use: Not Currently   Drug use: Never    Allergies: No Known Allergies  Medications Prior to Admission  Medication Sig Dispense Refill Last Dose   Prenatal Vit-Fe Fumarate-FA (PRENATAL PO) Take 2 tablets by mouth daily. Prenatal Gummies   11/16/2020    Review of Systems  Constitutional: Negative.  Negative for fatigue and fever.  HENT: Negative.    Respiratory: Negative.  Negative for shortness of breath.   Cardiovascular: Negative.  Negative for chest pain.  Gastrointestinal:  Positive for abdominal pain. Negative for constipation, diarrhea, nausea and vomiting.  Genitourinary: Negative.  Negative for dysuria, vaginal bleeding and vaginal discharge.  Neurological: Negative.  Negative for dizziness and headaches.  Physical Exam   Blood pressure 121/74, pulse 94, temperature 98.3 F (36.8 C),  temperature source Oral, resp. rate 17, last menstrual period 05/19/2020, SpO2 100 %.  Physical Exam Vitals and nursing note reviewed.  Constitutional:      General: She is not in acute distress.    Appearance: She is well-developed.  HENT:     Head: Normocephalic.  Eyes:     Pupils: Pupils are equal, round, and reactive to light.  Cardiovascular:     Rate and Rhythm: Normal rate and regular rhythm.     Heart sounds: Normal heart sounds.  Pulmonary:     Effort: Pulmonary effort is normal. No respiratory distress.     Breath sounds: Normal breath sounds.  Abdominal:     General: Bowel sounds are normal. There is no distension.     Palpations: Abdomen is soft.     Tenderness: There is no abdominal tenderness.  Skin:    General: Skin is warm and dry.  Neurological:     Mental Status: She is alert and oriented to person, place, and time.  Psychiatric:        Mood and Affect: Mood normal.        Behavior: Behavior normal.        Thought  Content: Thought content normal.        Judgment: Judgment normal.   Fetal Tracing:  Baseline: 150 Variability: moderate Accels: 10x10 Decels: none  Toco: 2-4   Dilation: Closed Effacement (%): Thick Exam by:: Len Blalock, CNM   MAU Course  Procedures Results for orders placed or performed during the hospital encounter of 11/17/20 (from the past 24 hour(s))  Urinalysis, Routine w reflex microscopic Urine, Clean Catch     Status: None   Collection Time: 11/17/20 11:07 AM  Result Value Ref Range   Color, Urine YELLOW YELLOW   APPearance CLEAR CLEAR   Specific Gravity, Urine 1.017 1.005 - 1.030   pH 7.0 5.0 - 8.0   Glucose, UA NEGATIVE NEGATIVE mg/dL   Hgb urine dipstick NEGATIVE NEGATIVE   Bilirubin Urine NEGATIVE NEGATIVE   Ketones, ur NEGATIVE NEGATIVE mg/dL   Protein, ur NEGATIVE NEGATIVE mg/dL   Nitrite NEGATIVE NEGATIVE   Leukocytes,Ua NEGATIVE NEGATIVE  Wet prep, genital     Status: Abnormal   Collection Time: 11/17/20  11:34 AM   Specimen: Vaginal  Result Value Ref Range   Yeast Wet Prep HPF POC NONE SEEN NONE SEEN   Trich, Wet Prep NONE SEEN NONE SEEN   Clue Cells Wet Prep HPF POC NONE SEEN NONE SEEN   WBC, Wet Prep HPF POC MODERATE (A) NONE SEEN   Sperm NONE SEEN     MDM UA Wet prep and gc/chalmydia  LR bolus- patient states pain is not improved and still feeling contractions Procardia series- contractions resolved  Cervix unchanged after 2.5 hours. Discussed preterm labor precautions at length and when to return to MAU  Assessment and Plan   1. Preterm contractions   2. [redacted] weeks gestation of pregnancy    -Discharge home in stable condition -Preterm labor precautions discussed -Patient advised to follow-up with OB as scheduled for prenatal care -Patient may return to MAU as needed or if her condition were to change or worsen   Wende Mott CNM 11/17/2020, 11:32 AM

## 2020-11-17 NOTE — MAU Provider Note (Signed)
History     CSN: [id number]  Arrival date and time: 11/17/20 2039   Event Date/Time   First Provider Initiated Contact with Patient 11/17/20 2201      Chief Complaint  Patient presents with   Contractions   Sheri Lawrence is a 25 y.o. G1P0 at [redacted]w[redacted]d who receives care at [facility].  She presents today for Contractions.  Patient states she came in this morning due to contractions and has returned because she "started feeling pain again."  Patient states the pain "come and go" and started " a couple of hours" ago.  She states the pain is located on her left side in her abdomen and back.  She describes the pain as "cramping, but mild pain at the same time."  She states she has not taken anything for her pain.  She also reports a headache that is located on her left side and she states "it is throbbing."  She rates the headache a 6/10.    OB History     Gravida  1   Para      Term      Preterm      AB      Living         SAB      IAB      Ectopic      Multiple      Live Births              History reviewed. No pertinent past medical history.  Past Surgical History:  Procedure Laterality Date   TONSILLECTOMY      Family History  Family history unknown: Yes    Social History   Tobacco Use   Smoking status: Never   Smokeless tobacco: Never  Vaping Use   Vaping Use: Never used  Substance Use Topics   Alcohol use: Not Currently   Drug use: Never    Allergies: No Known Allergies  Medications Prior to Admission  Medication Sig Dispense Refill Last Dose   Prenatal Vit-Fe Fumarate-FA (PRENATAL PO) Take 2 tablets by mouth daily. Prenatal Gummies       Review of Systems  Constitutional:  Negative for chills and fever.  Eyes:  Negative for visual disturbance.  Respiratory:  Negative for cough and shortness of breath.   Gastrointestinal:  Positive for abdominal pain (Left Side), nausea (None currently) and vomiting.  Genitourinary:  Negative for  difficulty urinating, dysuria, vaginal bleeding and vaginal discharge.  Musculoskeletal:  Positive for back pain (Left side).  Neurological:  Positive for headaches. Negative for dizziness and light-headedness.  Physical Exam   Blood pressure 117/64, pulse 89, temperature 98.3 F (36.8 C), resp. rate 18, height 5\' 3"  (1.6 m), weight 91.2 kg, last menstrual period 05/19/2020.  Physical Exam Vitals reviewed. Exam conducted with a chaperone present.  Constitutional:      Appearance: Normal appearance.  HENT:     Head: Normocephalic and atraumatic.  Eyes:     Conjunctiva/sclera: Conjunctivae normal.  Cardiovascular:     Rate and Rhythm: Normal rate and regular rhythm.     Heart sounds: Normal heart sounds.  Pulmonary:     Effort: Pulmonary effort is normal.     Breath sounds: Normal breath sounds.  Abdominal:     General: Bowel sounds are normal.     Palpations: Abdomen is soft.     Tenderness: There is abdominal tenderness in the left upper quadrant. There is no right CVA tenderness, left CVA tenderness, guarding or  rebound.  Genitourinary:    Comments: Dilation: Closed Effacement (%): 0 Cervical Position: [place]: Ballotable Exam by:: J.Tameem Pullara, CNM  Musculoskeletal:        General: Normal range of motion.     Cervical back: Normal range of motion.  Skin:    General: Skin is warm and dry.  Neurological:     Mental Status: She is alert and oriented to person, place, and time.  Psychiatric:        Mood and Affect: Mood normal.        Behavior: Behavior normal.        Thought Content: Thought content normal.    Fetal Assessment 150 bpm, Mod Var, -Decels, -Accels Toco: None graphed  MAU Course   Results for orders placed or performed during the hospital encounter of 11/17/20 (from the past 24 hour(s))  Urinalysis, Routine w reflex microscopic Urine, Clean Catch     Status: None   Collection Time: 11/17/20 11:07 AM  Result Value Ref Range   Color, Urine YELLOW  YELLOW   APPearance CLEAR CLEAR   Specific Gravity, Urine 1.017 1.005 - 1.030   pH 7.0 5.0 - 8.0   Glucose, UA NEGATIVE NEGATIVE mg/dL   Hgb urine dipstick NEGATIVE NEGATIVE   Bilirubin Urine NEGATIVE NEGATIVE   Ketones, ur NEGATIVE NEGATIVE mg/dL   Protein, ur NEGATIVE NEGATIVE mg/dL   Nitrite NEGATIVE NEGATIVE   Leukocytes,Ua NEGATIVE NEGATIVE  Wet prep, genital     Status: Abnormal   Collection Time: 11/17/20 11:34 AM   Specimen: Vaginal  Result Value Ref Range   Yeast Wet Prep HPF POC NONE SEEN NONE SEEN   Trich, Wet Prep NONE SEEN NONE SEEN   Clue Cells Wet Prep HPF POC NONE SEEN NONE SEEN   WBC, Wet Prep HPF POC MODERATE (A) NONE SEEN   Sperm NONE SEEN    No results found.  MDM PE Labs: UA EFM Muscle Relaxant Analgesic AntiEmetic Prescription Assessment and Plan  25 year old G1P0  SIUP at 3 weeks Cat I FT Back Pain Abdominal Pain Headache  -POC Reviewed -Informed that all testing was completed this morning.  -Exam performed and findings discussed. -Reassured that cervix remains closed. -Discussed mgmt of pain with medication. -Will give flexeril and tylenol and reassess.   Maryann Conners MSN, CNM 11/17/2020, 10:01 PM   Reassessment (11:13 PM) Nausea  -Patient reports pain has improved and now 4/10. -Reviewed UA.  Encouraged hydration at home. -Patient reports nausea.  Will give zofran 4mg  now. -Will send limited supply to pharmacy for home usage. -Reviewed Labor Precautions. -Encouraged to call primary office or return to MAU if symptoms worsen or with the onset of new symptoms. -Discharged to home in improving condition.  Maryann Conners MSN, CNM Advanced Practice Provider, Center for [facility]

## 2020-11-17 NOTE — MAU Note (Signed)
Pt was here earlier today with back pain and cramping. Was having ctx. Given some medication and they went away. Went home. Started having a headache and vomited x1 . Cramping started  up again. Feeling ctx q 5 min . Cervix was closed earlier today. Denies any vag bleeding or leaking a this time. Good fetal movement.

## 2020-11-17 NOTE — MAU Note (Signed)
.  Sheri Lawrence is a 25 y.o. at [redacted]w[redacted]d here in MAU reporting: lower abdominal pressure that radiates to her back. She states that she feels like it may be "bladder related" like she has a UTI. She has had urinary frequency but no burning. Denies VB or LOF. Endorses good fetal movement. Denies ctx. No recent IC.   Pain score: 6 Vitals:   11/17/20 1118  BP: 119/83  Pulse: 100  Resp: 17  Temp: 98.3 F (36.8 C)  SpO2: 100%     FHT:144 Lab orders placed from triage:  UA

## 2020-11-17 NOTE — MAU Note (Signed)
Pt up to bathroom to urinate-medicated per order for headache

## 2020-11-19 ENCOUNTER — Telehealth: Payer: Self-pay | Admitting: *Deleted

## 2020-11-19 LAB — GC/CHLAMYDIA PROBE AMP (~~LOC~~) NOT AT ARMC
Chlamydia: NEGATIVE
Comment: NEGATIVE
Comment: NORMAL
Neisseria Gonorrhea: NEGATIVE

## 2020-11-19 NOTE — Telephone Encounter (Signed)
Patient called to inform nurse that she was seen twice over the weekend for contractions. Patient was given Procardia, Flexeril and Tylenol during the visits. Advised patient if she continues to have the symptoms to go back to MAU for further evaluation.   Derl Barrow, RN

## 2020-11-28 ENCOUNTER — Encounter: Payer: Self-pay | Admitting: Obstetrics and Gynecology

## 2020-11-28 ENCOUNTER — Other Ambulatory Visit: Payer: Self-pay

## 2020-11-28 ENCOUNTER — Ambulatory Visit (INDEPENDENT_AMBULATORY_CARE_PROVIDER_SITE_OTHER): Payer: BC Managed Care – PPO | Admitting: Obstetrics and Gynecology

## 2020-11-28 VITALS — BP 106/68 | HR 105 | Temp 98.0°F | Wt 201.8 lb

## 2020-11-28 DIAGNOSIS — Z3400 Encounter for supervision of normal first pregnancy, unspecified trimester: Secondary | ICD-10-CM

## 2020-11-28 DIAGNOSIS — Z3A27 27 weeks gestation of pregnancy: Secondary | ICD-10-CM

## 2020-11-28 NOTE — Progress Notes (Signed)
   LOW-RISK PREGNANCY OFFICE VISIT Patient name: Sheri Lawrence MRN [id number]  Date of birth: [birth year] Chief Complaint:   Routine Prenatal Visit  History of Present Illness:   [name] is a 25 y.o. G1P0 female at [redacted]w[redacted]d with an Estimated Date of Delivery: 02/23/21 being seen today for ongoing management of a low-risk pregnancy.  Today she reports no complaints. Wanted to come to office because she lost her mucous plug after being seen twice in MAU on 11/17/2020 for PTL. She expresses concern of baby being early and being small. Contractions: Not present. Vag. Bleeding: None.  Movement: Present. denies leaking of fluid. Review of Systems:   Pertinent items are noted in HPI Denies abnormal vaginal discharge w/ itching/odor/irritation, headaches, visual changes, shortness of breath, chest pain, abdominal pain, severe nausea/vomiting, or problems with urination or bowel movements unless otherwise stated above. Pertinent History Reviewed:  Reviewed past medical,surgical, social, obstetrical and family history.  Reviewed problem list, medications and allergies. Physical Assessment:   Vitals:   11/28/20 1515  BP: 106/68  Pulse: (!) 105  Temp: 98 F (36.7 C)  Weight: 201 lb 12.8 oz (91.5 kg)  Body mass index is 35.75 kg/m.        Physical Examination:   General appearance: Well appearing, and in no distress  Mental status: Alert, oriented to person, place, and time  Skin: Warm & dry  Cardiovascular: Normal heart rate noted  Respiratory: Normal respiratory effort, no distress  Abdomen: Soft, gravid, nontender  Pelvic: Cervical exam performed         Extremities: Edema: None  Fetal Status: Fetal Heart Rate (bpm): 150   Movement: Present    No results found for this or any previous visit (from the past 24 hour(s)).  Assessment & Plan:  1) Low-risk pregnancy G1P0 at [redacted]w[redacted]d with an Estimated Date of Delivery: 02/23/21   2) Supervision of normal first pregnancy, antepartum -  Discussed with her about losing mucous plug is not indicative of impending labor - Reviewed PTL - Information provided on Preventing PTL   3) [redacted] weeks gestation of pregnancy    Meds: No orders of the defined types were placed in this encounter.  Labs/procedures today: cervical exam  Plan:  Continue routine obstetrical care   Reviewed: Preterm labor symptoms and general obstetric precautions including but not limited to vaginal bleeding, contractions, leaking of fluid and fetal movement were reviewed in detail with the patient.  All questions were answered. Has home bp cuff. Check bp weekly, let us know if >140/90.   Follow-up: Return in about 1 week (around 12/05/2020) for Return OB 2hr GTT.  No orders of the defined types were placed in this encounter.  Laury Deep MSN, CNM 11/28/2020 3:53 PM

## 2020-11-28 NOTE — Patient Instructions (Signed)
Preterm Labor The normal length of a pregnancy is 39-41 weeks. Preterm labor is when labor starts before 37 completed weeks of pregnancy. Babies who are born prematurely and survive may not be fully developed and may be at an increased risk for long-term problems such as cerebral palsy, developmental delays, and vision andhearing problems. Babies who are born too early may have problems soon after birth. Premature babies may have problems regulating blood sugar, body temperature, heart rate, and breathing rate. These babies often have trouble with feeding. The risk ofhaving problems is highest for babies who are born before 34 weeks of pregnancy. What are the causes? The exact cause of this condition is not known. What increases the risk? You are more likely to have preterm labor if you have certain risk factors that relate to your medical history, problems with present and past pregnancies, andlifestyle factors. Medical history You have abnormalities of the uterus, including a short cervix. You have STIs (sexually transmitted infections) or other infections of the urinary tract and the vagina. You have chronic illnesses, such as blood clotting problems, diabetes, or high blood pressure. You are overweight or underweight. Present and past pregnancies You have had preterm labor before. You are pregnant with twins or other multiples. You have been diagnosed with a condition in which the placenta covers your cervix (placenta previa). You waited less than 18 months between giving birth and becoming pregnant again. Your unborn baby has some abnormalities. You have vaginal bleeding during pregnancy. You became pregnant through in vitro fertilization (IVF). Lifestyle and environmental factors You use tobacco products or drink alcohol. You use drugs. You have stress and no social support. You experience domestic violence. You are exposed to certain chemicals or environmental pollutants. Other  factors You are younger than age 8 or older than age 36. What are the signs or symptoms? Symptoms of this condition include: Cramps similar to those that can happen during a menstrual period. The cramps may happen with diarrhea. Pain in the abdomen or lower back. Regular contractions that may feel like tightening of the abdomen. A feeling of increased pressure in the pelvis. Increased watery or bloody mucus discharge from the vagina. Water breaking (ruptured amniotic sac). How is this diagnosed? This condition is diagnosed based on: Your medical history and a physical exam. A pelvic exam. An ultrasound. Monitoring your uterus for contractions. Other tests, including: A swab of the cervix to check for a chemical called fetal fibronectin. Urine tests. How is this treated? Treatment for this condition depends on the length of your pregnancy, your condition, and the health of your baby. Treatment may include: Taking medicines, such as: Hormone medicines. These may be given early in pregnancy to help support the pregnancy. Medicines to stop contractions. Medicines to help mature the baby's lungs. These may be prescribed if the risk of delivery is high. Medicines to help protect your baby from brain and nerve complications such as cerebral palsy. Bed rest. If the labor happens before 34 weeks of pregnancy, you may need to stay in the hospital. Delivery of the baby. Follow these instructions at home:  Do not use any products that contain nicotine or tobacco. These products include cigarettes, chewing tobacco, and vaping devices, such as e-cigarettes. If you need help quitting, ask your health care provider. Do not drink alcohol. Take over-the-counter and prescription medicines only as told by your health care provider. Rest as told by your health care provider. Return to your normal activities as told by your  health care provider. Ask your health care provider what activities are safe for  you. Keep all follow-up visits. This is important. How is this prevented? To increase your chance of having a full-term pregnancy: Do not use drugs or take medicines that have not been prescribed to you during your pregnancy. Talk with your health care provider before taking any herbal supplements, even if you have been taking them regularly. Make sure you gain a healthy amount of weight during your pregnancy. Watch for infection. If you think that you might have an infection, get it checked right away. Symptoms of infection may include: Fever. Abnormal vaginal discharge or discharge that smells bad. Pain or burning with urination. Needing to urinate urgently. Frequently urinating or passing small amounts of urine frequently. Blood in your urine or urine that smells bad or unusual. Where to find more information U.S. Department of Health and Programmer, systems on Women's Health: [place]BeachSigns.tn [employer] of Obstetricians and Gynecologists: www.acog.org Centers for Disease Control and Prevention, Preterm Birth: http://www.wolf.info/ Contact a health care provider if: You think you are going into preterm labor. You have signs or symptoms of preterm labor. You have symptoms of infection. Get help right away if: You are having regular, painful contractions every 5 minutes or less. Your water breaks. Summary Preterm labor is labor that starts before you reach 37 weeks of pregnancy. Delivering your baby early increases your baby's risk of developing long-term problems. You are more likely to have preterm labor if you have certain risk factors that relate to your medical history, problems with present and past pregnancies, and lifestyle factors. Keep all follow-up visits. This is important. Contact a health care provider if you have signs or symptoms of preterm labor. This information is not intended to replace advice given to you by your health care provider. Make sure you discuss  any questions you have with your healthcare provider. Document Revised: 05/15/2020 Document Reviewed: 05/15/2020 Elsevier Patient Education  2022 [employer]. Preventing Preterm Birth Preterm birth is when a baby is delivered between 4 weeks and 66 weeks of pregnancy. A full-term pregnancy lasts for at least 37 complete weeks. Preterm birth can increase the risk of complications for your baby because the baby hasnot matured fully before being born. How can preterm birth affect my baby? Complications of preterm birth may include: Breathing problems. Problems with vision or hearing. Trouble with feeding. Infections or inflammation of the digestive tract (colitis). Low birth weight or very low birth weight. Brain damage that causes developmental delays and learning disabilities, and that affects movement and coordination (cerebral palsy). Higher risk for diabetes, heart disease, and high blood pressure later in life. What can increase my risk of having a preterm birth? Medical History The exact cause of preterm birth is unknown. The following factors make you more likely to have a preterm birth: Being diagnosed with placenta previa. This is a condition in which the placenta covers the lowest part of your uterus (cervix), which opens into the vagina. Certain conditions of your current and past pregnancies, such as: Having had a preterm birth before. Being pregnant with multiples. Having less than 18 months between pregnancies. Certain abnormalities in your unborn baby. Vaginal bleeding during pregnancy. Becoming pregnant through in vitro fertilization (IVF). Being overweight or underweight. Medical history of: STIs (sexually transmitted infections) or other infections of the urinary tract and the vagina. Long-term (chronic) illnesses, such as blood clotting problems, diabetes, or high blood pressure. Short cervix. Lifestyle and environmental  factors Using tobacco products or  drugs. Drinking alcohol. Having stress and no social support. Violence in the home (domestic violence). Being exposed to certain chemicals or pollutants in the environment. What actions can I take to prevent preterm birth?  Medical care The most important thing you can do to lower your risk for preterm birth is to get routine medical care during pregnancy (prenatal care). Keep all follow-up visits. This is important. If you have a high risk of preterm birth: You may be referred to a health care provider who specializes in managing high-risk pregnancies (perinatologist). You may be given medicine to help prevent preterm birth. Lifestyle Certain lifestyle changes can also lower your risk of preterm birth: Wait at least 6 months after a pregnancy to become pregnant again. Get to a healthy weight before getting pregnant. If you are overweight, work with your health care provider to safely lose weight. Do not use any products that contain nicotine or tobacco. These products include cigarettes, chewing tobacco, and vaping devices, such as e-cigarettes. If you need help quitting, ask your health care provider. Do not drink alcohol. Do not use drugs. Eat a healthy diet. Manage other medical problems, such as diabetes or high blood pressure. Where to find support For more support, consider: Talking with your health care provider. Talking with a therapist or substance abuse counselor, if you need help quitting. Working with a Microbiologist or a Physiological scientist to maintain a healthy weight. Joining a support group. Where to find more information Learn more about preventing preterm birth from: Centers for Disease Control and Prevention: StoreMirror.com.cy March of Dimes: marchofdimes.org American Pregnancy Association: americanpregnancy.org Contact a health care provider if: You have any of the following symptoms of preterm labor before 37 weeks: A change or increase in vaginal discharge. Fluid leaking from  your vagina. Pressure or cramps in your lower abdomen. A backache that does not go away or gets worse. Regular tightening (contractions) in your lower abdomen. Get help right away if: You are having regular painful contractions every 5 minutes or less. Your water breaks. Summary Preterm birth means having your baby during weeks 63-37 of pregnancy. Preterm birth may put your baby at risk for health complications. The exact cause of preterm birth is unknown. Getting routine prenatal care can help prevent preterm birth. Keep all follow-up visits. This is important. Contact a health care provider if you have symptoms of preterm labor. This information is not intended to replace advice given to you by your health care provider. Make sure you discuss any questions you have with your healthcare provider. Document Revised: 05/15/2020 Document Reviewed: 05/15/2020 Elsevier Patient Education  2022 [employer].

## 2020-12-06 ENCOUNTER — Ambulatory Visit (INDEPENDENT_AMBULATORY_CARE_PROVIDER_SITE_OTHER): Payer: BC Managed Care – PPO

## 2020-12-06 ENCOUNTER — Telehealth: Payer: Self-pay | Admitting: Licensed Clinical Social Worker

## 2020-12-06 ENCOUNTER — Other Ambulatory Visit: Payer: Self-pay

## 2020-12-06 ENCOUNTER — Encounter: Payer: BC Managed Care – PPO | Admitting: Licensed Clinical Social Worker

## 2020-12-06 VITALS — BP 120/77 | HR 96 | Temp 98.2°F | Wt 203.4 lb

## 2020-12-06 DIAGNOSIS — Z23 Encounter for immunization: Secondary | ICD-10-CM | POA: Diagnosis not present

## 2020-12-06 DIAGNOSIS — O99013 Anemia complicating pregnancy, third trimester: Secondary | ICD-10-CM

## 2020-12-06 DIAGNOSIS — Z3400 Encounter for supervision of normal first pregnancy, unspecified trimester: Secondary | ICD-10-CM | POA: Diagnosis not present

## 2020-12-06 DIAGNOSIS — Z3A28 28 weeks gestation of pregnancy: Secondary | ICD-10-CM

## 2020-12-06 NOTE — Telephone Encounter (Signed)
Called pt regarding scheduled mychart visit. Unable to leave message due to mailbox is full

## 2020-12-06 NOTE — Progress Notes (Signed)
   LOW-RISK PREGNANCY OFFICE VISIT  Patient name: Sheri Lawrence MRN [id number]  Date of birth: Aug 28, 1995 Chief Complaint:   Routine Prenatal Visit  Subjective:   Sheri Lawrence is a 25 y.o. G1P0 female at [redacted]w[redacted]d with an Estimated Date of Delivery: 02/23/21 being seen today for ongoing management of a -risk pregnancy aeb has Supervision of normal first pregnancy, antepartum on their problem list.  Patient presents today with no complaints.  Patient endorses fetal movement. Patient denies abdominal cramping or contractions.  Patient denies vaginal concerns including abnormal discharge, leaking of fluid, and bleeding.  Contractions: Not present. Vag. Bleeding: None.  Movement: Present.  Reviewed past medical,surgical, social, obstetrical and family history as well as problem list, medications and allergies.  Objective   Vitals:   12/06/20 0804  BP: 120/77  Pulse: 96  Temp: 98.2 F (36.8 C)  Weight: 203 lb 6.4 oz (92.3 kg)  Body mass index is 36.03 kg/m.  Total Weight Gain:13 lb 6.4 oz (6.078 kg)         Physical Examination:   General appearance: Well appearing, and in no distress  Mental status: Alert, oriented to person, place, and time  Skin: Warm & dry  Cardiovascular: Normal heart rate noted  Respiratory: Normal respiratory effort, no distress  Abdomen: Soft, gravid, nontender, AGA with Fundal Height: 29 cm  Pelvic: Cervical exam deferred           Extremities: Edema: None  Fetal Status: Fetal Heart Rate (bpm): 143  Movement: Present   No results found for this or any previous visit (from the past 24 hour(s)).  Assessment & Plan:  Low-risk pregnancy of a 25 y.o., G1P0 at [redacted]w[redacted]d with an Estimated Date of Delivery: 02/23/21   1. Supervision of normal first pregnancy, antepartum -Anticipatory guidance for upcoming appts. -Patient to schedule next appt in 2 weeks for an in-person visit. -Reviewed blood draw procedures and labs which also include check of iron/HgB level,  RPR, and HIV *Informed that repeat RPR/HIV are for pediatric records/compliance.  -Discussed how results of GTT are handled including diabetic education and BS testing for abnormal results and routine care for normal results.   2. [redacted] weeks gestation of pregnancy -Doing well. -Reviewed desire for H20 birth.  Informed that consent will be signed around 36 weeks.   3. Need for tetanus, diphtheria, and acellular pertussis (Tdap) vaccine in patient of adolescent age or older -Given today      Meds: No orders of the defined types were placed in this encounter.  Labs/procedures today: Lab Orders  Glucose Tolerance, 2 Hours w/1 Hour  HIV Antibody (routine testing w rflx)  RPR  CBC    Reviewed: Preterm labor symptoms and general obstetric precautions including but not limited to vaginal bleeding, contractions, leaking of fluid and fetal movement were reviewed in detail with the patient.  All questions were answered.  Follow-up: No follow-ups on file.  Orders Placed This Encounter  Procedures   Tdap vaccine greater than or equal to 7yo IM   Glucose Tolerance, 2 Hours w/1 Hour   HIV Antibody (routine testing w rflx)   RPR   CBC   Maryann Conners MSN, CNM 12/06/2020

## 2020-12-07 LAB — HIV ANTIBODY (ROUTINE TESTING W REFLEX): HIV Screen 4th Generation wRfx: NONREACTIVE

## 2020-12-07 LAB — CBC
Hematocrit: 30.4 % — ABNORMAL LOW (ref 34.0–46.6)
Hemoglobin: 9.4 g/dL — ABNORMAL LOW (ref 11.1–15.9)
MCH: 26.1 pg — ABNORMAL LOW (ref 26.6–33.0)
MCHC: 30.9 g/dL — ABNORMAL LOW (ref 31.5–35.7)
MCV: 84 fL (ref 79–97)
Platelets: 281 10*3/uL (ref 150–450)
RBC: 3.6 x10E6/uL — ABNORMAL LOW (ref 3.77–5.28)
RDW: 14.6 % (ref 11.7–15.4)
WBC: 9.8 10*3/uL (ref 3.4–10.8)

## 2020-12-07 LAB — RPR: RPR Ser Ql: NONREACTIVE

## 2020-12-07 LAB — GLUCOSE TOLERANCE, 2 HOURS W/ 1HR
Glucose, 1 hour: 149 mg/dL (ref 65–179)
Glucose, 2 hour: 112 mg/dL (ref 65–152)
Glucose, Fasting: 78 mg/dL (ref 65–91)

## 2020-12-08 DIAGNOSIS — D509 Iron deficiency anemia, unspecified: Secondary | ICD-10-CM | POA: Insufficient documentation

## 2020-12-08 DIAGNOSIS — O99013 Anemia complicating pregnancy, third trimester: Secondary | ICD-10-CM | POA: Insufficient documentation

## 2020-12-13 MED ORDER — FERROUS SULFATE 325 (65 FE) MG PO TBEC: 325.0000 mg | DELAYED_RELEASE_TABLET | ORAL | 2 refills | Status: DC

## 2020-12-13 NOTE — Addendum Note (Signed)
Addended by: Maryann Conners on: 12/13/2020 06:35 PM   Modules accepted: Orders

## 2020-12-21 ENCOUNTER — Telehealth (INDEPENDENT_AMBULATORY_CARE_PROVIDER_SITE_OTHER): Payer: BC Managed Care – PPO

## 2020-12-21 VITALS — BP 116/71 | HR 91

## 2020-12-21 DIAGNOSIS — Z3400 Encounter for supervision of normal first pregnancy, unspecified trimester: Secondary | ICD-10-CM

## 2020-12-21 DIAGNOSIS — Z3A30 30 weeks gestation of pregnancy: Secondary | ICD-10-CM

## 2020-12-21 DIAGNOSIS — D649 Anemia, unspecified: Secondary | ICD-10-CM

## 2020-12-21 DIAGNOSIS — O99013 Anemia complicating pregnancy, third trimester: Secondary | ICD-10-CM

## 2020-12-21 NOTE — Progress Notes (Signed)
OBSTETRICS PRENATAL VIRTUAL VISIT ENCOUNTER NOTE  Provider location: [facility]   Patient location: Home  I connected with Sheri Lawrence on 12/21/20 at 11:10 AM EDT by MyChart Video Encounter and verified that I am speaking with the correct person using two identifiers. I discussed the limitations, risks, security and privacy concerns of performing an evaluation and management service virtually and the availability of in person appointments. I also discussed with the patient that there may be a patient responsible charge related to this service. The patient expressed understanding and agreed to proceed. Subjective:  Sheri Lawrence is a 25 y.o. G1P0 at 30w6dbeing seen today for ongoing prenatal care.  She is currently monitored for the following issues for this low-risk pregnancy and has Supervision of normal first pregnancy, antepartum and Anemia of mother in pregnancy, third trimester on their problem list.  Patient reports no complaints.  She endorses fetal movement and denies abdominal cramping or contractions. She denies issues with urination, constipation, or diarrhea.  She denies vaginal concerns including bleeding, leaking, and discharge.   Contractions: Not present. Vag. Bleeding: None.  Movement: Present. Denies any leaking of fluid.   The following portions of the patient's history were reviewed and updated as appropriate: allergies, current medications, past family history, past medical history, past social history, past surgical history and problem list.   Objective:   Vitals:   12/21/20 1115  BP: 116/71  Pulse: 91    Fetal Status:     Movement: Present     General:  Alert, oriented and cooperative. Patient is in no acute distress.  Respiratory: Normal respiratory effort, no problems with respiration noted  Mental Status: Normal mood and affect. Normal behavior. Normal judgment and thought content.  Rest of physical exam deferred  due to type of encounter  Imaging: No results found.  Assessment and Plan:  Pregnancy: G1P0 at 348w6d. Supervision of normal first pregnancy, antepartum -Anticipatory guidance for upcoming appts. -Patient to schedule next appt in 2 weeks for a virtual visit.  2. Anemia of mother in pregnancy, third trimester -Hgb was 9.7 at 28 weeks -Taking iron supplement QOD -Will recheck around 34 weeks to assess need for iron infusion.   Preterm labor symptoms and general obstetric precautions including but not limited to vaginal bleeding, contractions, leaking of fluid and fetal movement were reviewed in detail with the patient. I discussed the assessment and treatment plan with the patient. The patient was provided an opportunity to ask questions and all were answered. The patient agreed with the plan and demonstrated an understanding of the instructions. The patient was advised to call back or seek an in-person office evaluation/[facility] any urgent or concerning symptoms. Please refer to After Visit Summary for other counseling recommendations.   I provided 4 minutes of face-to-face time during this encounter.  Return in about 2 weeks (around 01/04/2021) for Virtual LR-ROB.  Future Appointments  Date Time Provider [place]8/03/2021  9:10 AM DaLaury DeepCNM [facility] None  01/07/2021  9:30 AM [facility] NURSE [facility]  9:45 AM [facility]  2:30 PM DaLaury DeepCNM [facility] None  01/31/2021  8:55 AM DaLaury DeepCNM [facility] None  02/07/2021  8:55 AM DaLaury DeepCNM [facility] None  02/14/2021  8:55 AM DaLaury DeepCNM [facility] None  02/21/2021  8:55 AM DaLaury DeepCNM [facility] None    JeMaryann ConnersCNM  [facility]

## 2020-12-31 ENCOUNTER — Inpatient Hospital Stay (HOSPITAL_BASED_OUTPATIENT_CLINIC_OR_DEPARTMENT_OTHER): Payer: BC Managed Care – PPO

## 2020-12-31 ENCOUNTER — Other Ambulatory Visit: Payer: Self-pay

## 2020-12-31 ENCOUNTER — Encounter (HOSPITAL_COMMUNITY): Payer: Self-pay | Admitting: Obstetrics & Gynecology

## 2020-12-31 ENCOUNTER — Inpatient Hospital Stay (HOSPITAL_COMMUNITY)
Admission: AD | Admit: 2020-12-31 | Discharge: 2020-12-31 | Disposition: A | Discharge status: Home / Self Care | Payer: BC Managed Care – PPO | Attending: Obstetrics & Gynecology | Admitting: Obstetrics & Gynecology

## 2020-12-31 DIAGNOSIS — O99891 Other specified diseases and conditions complicating pregnancy: Secondary | ICD-10-CM | POA: Diagnosis not present

## 2020-12-31 DIAGNOSIS — O26893 Other specified pregnancy related conditions, third trimester: Secondary | ICD-10-CM | POA: Diagnosis not present

## 2020-12-31 DIAGNOSIS — M79606 Pain in leg, unspecified: Secondary | ICD-10-CM

## 2020-12-31 DIAGNOSIS — R252 Cramp and spasm: Secondary | ICD-10-CM | POA: Diagnosis not present

## 2020-12-31 DIAGNOSIS — Z3A32 32 weeks gestation of pregnancy: Secondary | ICD-10-CM | POA: Diagnosis not present

## 2020-12-31 HISTORY — DX: Urinary tract infection, site not specified: N39.0

## 2020-12-31 HISTORY — DX: Benign neoplasm of connective and other soft tissue, unspecified: D21.9

## 2020-12-31 HISTORY — DX: Depression, unspecified: F32.A

## 2020-12-31 NOTE — Progress Notes (Signed)
Lower extremity venous has been completed.   Preliminary results in CV Proc.   Abram Sander 12/31/2020 5:44 PM

## 2020-12-31 NOTE — MAU Note (Signed)
Pt reports leg spasm in her calfs for the past few days. More pain in left calf.  Pain is mostly all day lng then has he spasms in the morning( that make the legs hurt all day)good fetal movement felt. Denies any vag bleeding or leaking  ctx.

## 2020-12-31 NOTE — Discharge Instructions (Signed)
LEG CRAMPS -- Leg cramps are common, usually occurring during the latter half of pregnancy. The cramps are due to painful muscle contractions and are generally experienced in the calves at night. They are thought to be secondary to a buildup of lactic and pyruvic acids leading to involuntary contraction of the affected muscles, but the exact etiology is unknown.  A Cochrane review found that the only placebo-controlled trial of calcium treatment showed no evidence of benefit in the treatment of leg cramps, but placebo controlled trials of magnesium supplementation suggested a possible benefit (odds ratio 0.18, 95 percent CI 0.05 to 0.60). This was a small trial of 19 pregnant women with persistent leg cramps. The preparation used was magnesium lactate or citrate 5 mmol in the morning and 10 mmol in the evening.    Stretching exercises may be an effective preventive measure. These can be performed in the weight-bearing position; they are held for 20 seconds and repeated three times in succession, four times daily for one week, then twice daily thereafter.  If a cramp occurs, calf stretches (toe raises), walking, or leg jiggling followed by leg elevation may be helpful. Other nonpharmacologic remedies include: ?A hot shower or warm tub bath ?Ice massage ?Regular exercise for conditioning, calf strengthening and stretching ?Increased hydration ?Use of long-countered shoes and other proper foot gear.

## 2020-12-31 NOTE — MAU Provider Note (Signed)
History     CSN: [id number]  Arrival date and time: 12/31/20 1631   Event Date/Time   First Provider Initiated Contact with Patient 12/31/20 1839      Chief Complaint  Patient presents with   leg spasm   HPI  Sheri Lawrence is a 25 y.o. female G1P0 @ 65w2dhere in MAU with complaints of bilateral leg cramps. She reports Left calf pain greater than right calf. Pain is near calf's and hehind knee. The pain is worse in the morning. The pain comes and then is gone by midday. The spasms have woken her up in the middle of the night and stretching helps relieve it. She has not tried any OTC remedy's. She denies SOB or chest pain.   OB History     Gravida  1   Para      Term      Preterm      AB      Living         SAB      IAB      Ectopic      Multiple      Live Births              Past Medical History:  Diagnosis Date   Depression    currently in therapy   Fibroid    UTI (urinary tract infection)     Past Surgical History:  Procedure Laterality Date   TONSILLECTOMY      Family History  Problem Relation Age of Onset   Healthy Mother    Other Father 471      "died in his sleep"    Social History   Tobacco Use   Smoking status: Never   Smokeless tobacco: Never  Vaping Use   Vaping Use: Never used  Substance Use Topics   Alcohol use: Not Currently   Drug use: Never    Allergies: No Known Allergies  Medications Prior to Admission  Medication Sig Dispense Refill Last Dose   ferrous sulfate 325 (65 FE) MG EC tablet Take 1 tablet (325 mg total) by mouth every other day. 45 tablet 2 12/30/2020   Prenatal Vit-Fe Fumarate-FA (PRENATAL PO) Take 2 tablets by mouth daily. Prenatal Gummies   12/30/2020   ondansetron (ZOFRAN-ODT) 4 MG disintegrating tablet Take 1 tablet (4 mg total) by mouth every 6 (six) hours as needed for nausea or vomiting. 10 tablet 0 More than a month   No results found for this or any previous visit (from the past 48  hour(s)).   Review of Systems  Respiratory:  Negative for shortness of breath.   Musculoskeletal:  Negative for gait problem.  Physical Exam   Blood pressure 122/68, pulse 88, temperature 98.6 F (37 C), resp. rate 18, height '5\' 3"'$  (1.6 m), weight 93.4 kg, last menstrual period 05/19/2020, SpO2 98 %.  Physical Exam Vitals and nursing note reviewed.  Constitutional:      General: She is not in acute distress.    Appearance: Normal appearance. She is not ill-appearing, toxic-appearing or diaphoretic.  HENT:     Head: Normocephalic.  Eyes:     Pupils: Pupils are equal, round, and reactive to light.  Pulmonary:     Effort: Pulmonary effort is normal.  Musculoskeletal:        General: No swelling or tenderness. Normal range of motion.  Skin:    General: Skin is warm.  Neurological:     Mental Status: She is  alert.     Motor: No weakness.     Coordination: Coordination normal.     Gait: Gait normal.     Comments: Bilateral calf's measured and equal in size 41.5 cm   Psychiatric:        Mood and Affect: Mood normal.   Fetal Tracing: Baseline: 135 bpm Variability: Moderate  Accelerations: 15x15 Decelerations: None Toco: Quiet   MAU Course  Procedures None  MDM  Bilateral DVT studies negative for DVT.   Assessment and Plan   A:  1. Bilateral leg cramps   2. [redacted] weeks gestation of pregnancy      P:  Dc home in stable condition Discussed leg cramps in pregnancy. OTC magnesium, warm showers, baths.  Return to MAU if symptoms worsen  Ok to use tylenol OTC as directed   Lezlie Lye, NP 12/31/2020 7:17 PM

## 2021-01-03 ENCOUNTER — Ambulatory Visit (INDEPENDENT_AMBULATORY_CARE_PROVIDER_SITE_OTHER): Payer: BC Managed Care – PPO | Admitting: Obstetrics and Gynecology

## 2021-01-03 ENCOUNTER — Other Ambulatory Visit: Payer: Self-pay

## 2021-01-03 VITALS — BP 103/63 | HR 86 | Temp 98.1°F | Wt 204.8 lb

## 2021-01-03 DIAGNOSIS — Z3400 Encounter for supervision of normal first pregnancy, unspecified trimester: Secondary | ICD-10-CM

## 2021-01-03 DIAGNOSIS — Z3A32 32 weeks gestation of pregnancy: Secondary | ICD-10-CM

## 2021-01-03 DIAGNOSIS — O99013 Anemia complicating pregnancy, third trimester: Secondary | ICD-10-CM

## 2021-01-03 NOTE — Progress Notes (Signed)
   LOW-RISK PREGNANCY OFFICE VISIT Patient name: Sheri Lawrence MRN [id number]  Date of birth: 1996-04-22 Chief Complaint:   Routine Prenatal Visit  History of Present Illness:   Sheri Lawrence is a 25 y.o. G1P0 female at 40w5dwith an Estimated Date of Delivery: 02/23/21 being seen today for ongoing management of a low-risk pregnancy.  Today she reports no complaints. Contractions: Not present. Vag. Bleeding: None.  Movement: Present. denies leaking of fluid. Review of Systems:   Pertinent items are noted in HPI Denies abnormal vaginal discharge w/ itching/odor/irritation, headaches, visual changes, shortness of breath, chest pain, abdominal pain, severe nausea/vomiting, or problems with urination or bowel movements unless otherwise stated above. Pertinent History Reviewed:  Reviewed past medical,surgical, social, obstetrical and family history.  Reviewed problem list, medications and allergies. Physical Assessment:   Vitals:   01/03/21 0927  BP: 103/63  Pulse: 86  Temp: 98.1 F (36.7 C)  Weight: 204 lb 12.8 oz (92.9 kg)  Body mass index is 36.28 kg/m.        Physical Examination:   General appearance: Well appearing, and in no distress  Mental status: Alert, oriented to person, place, and time  Skin: Warm & dry  Cardiovascular: Normal heart rate noted  Respiratory: Normal respiratory effort, no distress  Abdomen: Soft, gravid, nontender  Pelvic: Cervical exam deferred         Extremities: Edema: None  Fetal Status: Fetal Heart Rate (bpm): 149   Movement: Present    No results found for this or any previous visit (from the past 24 hour(s)).  Assessment & Plan:  1) Low-risk pregnancy G1P0 at 362w5dith an Estimated Date of Delivery: 02/23/21   2) Supervision of normal first pregnancy, antepartum  3) Anemia of mother in pregnancy, third trimester - Taking daily iron and vitamin C  4) [redacted] weeks gestation of pregnancy    Meds: No orders of the defined types were placed  in this encounter.  Labs/procedures today: none  Plan:  Continue routine obstetrical care   Reviewed: Preterm labor symptoms and general obstetric precautions including but not limited to vaginal bleeding, contractions, leaking of fluid and fetal movement were reviewed in detail with the patient.  All questions were answered. Has home bp cuff. Check bp weekly, let usKoreanow if >140/90.   Follow-up: No follow-ups on file.  No orders of the defined types were placed in this encounter.  RoLaury DeepSN, CNM 01/03/2021 1:49 PM

## 2021-01-07 ENCOUNTER — Ambulatory Visit: Payer: BC Managed Care – PPO | Admitting: *Deleted

## 2021-01-07 ENCOUNTER — Other Ambulatory Visit: Payer: Self-pay

## 2021-01-07 ENCOUNTER — Ambulatory Visit: Payer: BC Managed Care – PPO | Attending: Obstetrics and Gynecology

## 2021-01-07 VITALS — BP 114/67 | HR 88

## 2021-01-07 DIAGNOSIS — Z3400 Encounter for supervision of normal first pregnancy, unspecified trimester: Secondary | ICD-10-CM

## 2021-01-07 DIAGNOSIS — O3413 Maternal care for benign tumor of corpus uteri, third trimester: Secondary | ICD-10-CM

## 2021-01-07 DIAGNOSIS — D259 Leiomyoma of uterus, unspecified: Secondary | ICD-10-CM

## 2021-01-07 DIAGNOSIS — Z362 Encounter for other antenatal screening follow-up: Secondary | ICD-10-CM

## 2021-01-07 DIAGNOSIS — Z3A33 33 weeks gestation of pregnancy: Secondary | ICD-10-CM

## 2021-01-07 DIAGNOSIS — O283 Abnormal ultrasonic finding on antenatal screening of mother: Secondary | ICD-10-CM | POA: Insufficient documentation

## 2021-01-07 DIAGNOSIS — O99013 Anemia complicating pregnancy, third trimester: Secondary | ICD-10-CM | POA: Insufficient documentation

## 2021-01-17 ENCOUNTER — Telehealth: Payer: BC Managed Care – PPO | Admitting: Obstetrics and Gynecology

## 2021-01-31 ENCOUNTER — Ambulatory Visit (INDEPENDENT_AMBULATORY_CARE_PROVIDER_SITE_OTHER): Payer: BC Managed Care – PPO | Admitting: Obstetrics and Gynecology

## 2021-01-31 ENCOUNTER — Other Ambulatory Visit: Payer: Self-pay

## 2021-01-31 ENCOUNTER — Other Ambulatory Visit (HOSPITAL_COMMUNITY)
Admission: RE | Admit: 2021-01-31 | Discharge: 2021-01-31 | Disposition: A | Discharge status: Home / Self Care | Payer: BC Managed Care – PPO | Source: Ambulatory Visit | Attending: Obstetrics and Gynecology | Admitting: Obstetrics and Gynecology

## 2021-01-31 VITALS — BP 114/73 | HR 99 | Temp 98.1°F | Wt 210.0 lb

## 2021-01-31 DIAGNOSIS — O99891 Other specified diseases and conditions complicating pregnancy: Secondary | ICD-10-CM

## 2021-01-31 DIAGNOSIS — Z3403 Encounter for supervision of normal first pregnancy, third trimester: Secondary | ICD-10-CM | POA: Diagnosis not present

## 2021-01-31 DIAGNOSIS — Z124 Encounter for screening for malignant neoplasm of cervix: Secondary | ICD-10-CM | POA: Diagnosis present

## 2021-01-31 DIAGNOSIS — Z3400 Encounter for supervision of normal first pregnancy, unspecified trimester: Secondary | ICD-10-CM | POA: Insufficient documentation

## 2021-01-31 DIAGNOSIS — O99013 Anemia complicating pregnancy, third trimester: Secondary | ICD-10-CM

## 2021-01-31 DIAGNOSIS — Z3A36 36 weeks gestation of pregnancy: Secondary | ICD-10-CM | POA: Insufficient documentation

## 2021-01-31 DIAGNOSIS — R252 Cramp and spasm: Secondary | ICD-10-CM

## 2021-01-31 MED ORDER — MAGNESIUM 200 MG PO TABS: ORAL_TABLET | ORAL | 1 refills | Status: DC

## 2021-01-31 NOTE — Progress Notes (Signed)
   LOW-RISK PREGNANCY OFFICE VISIT Patient name: Sheri Lawrence MRN [id number]  Date of birth: January 27, 1996 Chief Complaint:   Routine Prenatal Visit  History of Present Illness:   [name] is a 25 y.o. G1P0 female at 11w5dwith an Estimated Date of Delivery: 02/23/21 being seen today for ongoing management of a low-risk pregnancy.  Today she reports  continued leg cramps in the morning . Contractions: Irregular. Vag. Bleeding: None.  Movement: Present. denies leaking of fluid. Review of Systems:   Pertinent items are noted in HPI Denies abnormal vaginal discharge w/ itching/odor/irritation, headaches, visual changes, shortness of breath, chest pain, abdominal pain, severe nausea/vomiting, or problems with urination or bowel movements unless otherwise stated above. Pertinent History Reviewed:  Reviewed past medical,surgical, social, obstetrical and family history.  Reviewed problem list, medications and allergies. Physical Assessment:   Vitals:   01/31/21 0854  BP: 114/73  Pulse: 99  Temp: 98.1 F (36.7 C)  Weight: 210 lb (95.3 kg)  Body mass index is 37.2 kg/m.        Physical Examination:   General appearance: Well appearing, and in no distress  Mental status: Alert, oriented to person, place, and time  Skin: Warm & dry  Cardiovascular: Normal heart rate noted  Respiratory: Normal respiratory effort, no distress  Abdomen: Soft, gravid, nontender  Pelvic: Cervical exam deferred - patient declined  Extremities: Edema: None  Fetal Status: Fetal Heart Rate (bpm): 150 Fundal Height: 37 cm Movement: Present    No results found for this or any previous visit (from the past 24 hour(s)).  Assessment & Plan:  1) Low-risk pregnancy G1P0 at 332w5dith an Estimated Date of Delivery: 02/23/21   2) Supervision of normal first pregnancy, antepartum  - Cervicovaginal ancillary only,  - Culture, beta strep (group b only),  - Cytology - PAP( [facility]) - Planning  waterbirth>>consent signed today  3) [redacted] weeks gestation of pregnancy   4) Pap smear for cervical cancer screening  - Cytology - PAP( [facility])  5) Leg cramps in pregnancy - Rx for Magnesium 200-400 mg hs  - Advised to take 1-2 tablets, but to decrease to 1 tablet hs  Meds:  Meds ordered this encounter  Medications   Magnesium 200 MG TABS    Sig: Take 1-2 tablets at bedtime    Dispense:  30 tablet    Refill:  1    Order Specific Question:   Supervising Provider    Answer:   PRDonnamae [id number]]    Labs/procedures today: pap, GBS, GC/CT  Plan:  Continue routine obstetrical care   Reviewed: Preterm labor symptoms and general obstetric precautions including but not limited to vaginal bleeding, contractions, leaking of fluid and fetal movement were reviewed in detail with the patient.  All questions were answered. Has home bp cuff. Check bp weekly, let usKoreanow if >140/90.   Follow-up: Return in about 1 week (around 02/07/2021) for Return OB visit.  Orders Placed This Encounter  Procedures   Culture, beta strep (group b only)   CBC   RoLaury DeepSN, CNM 01/31/2021 9:23 AM

## 2021-01-31 NOTE — Patient Instructions (Signed)
  Considering Waterbirth? Guide for patients at [facility]) Why consider waterbirth? Gentle birth for babies  Less pain medicine used in labor  May allow for passive descent/less pushing  May reduce perineal tears  More mobility and instinctive maternal position changes  Increased maternal relaxation   Is waterbirth safe? What are the risks of infection, drowning or other complications? Infection:  Very low risk (3.7 % for tub vs 4.8% for bed)  7 in 8000 waterbirths with documented infection  Poorly cleaned equipment most common cause  Slightly lower group B strep transmission rate  Drowning  Maternal:  Very low risk  Related to seizures or fainting  Newborn:  Very low risk. No evidence of increased risk of respiratory problems in multiple large studies  Physiological protection from breathing under water  Avoid underwater birth if there are any fetal complications  Once baby's head is out of the water, keep it out.  Birth complication  Some reports of cord trauma, but risk decreased by bringing baby to surface gradually  No evidence of increased risk of shoulder dystocia. Mothers can usually change positions faster in water than in a bed, possibly aiding the maneuvers to free the shoulder.   There are 2 things you MUST do to have a waterbirth with [facility]: Attend a waterbirth class at [place] at [facility]   3rd Wednesday of every month from 7-9 pm (virtual during [place]) [employer] at [email or web address] or [email or web address] or by calling [phone] Bring [place] the certificate from the class to your prenatal appointment or send via [place] with a midwife at 36 weeks* to see if you can still plan a waterbirth and to sign the consent.   *We also recommend that you schedule as many of your prenatal visits with a midwife as possible.    Helpful information: You may want to bring a bathing suit top to the hospital  to wear during labor but this is optional.  All other supplies are provided by the hospital. Please arrive at the hospital with signs of active labor, and do not wait at home until late in labor. It takes 45 min- 2 hours for COVID testing, fetal monitoring, and check in to your room to take place, plus transport and filling of the waterbirth tub.    Things that would prevent you from having a waterbirth: Unknown or Positive COVID-19 diagnosis upon admission to hospital* Premature, <37wks  Previous cesarean birth  Presence of thick meconium-stained fluid  Multiple gestation (Twins, triplets, etc.)  Uncontrolled diabetes or gestational diabetes requiring medication  Hypertension diagnosed in pregnancy or preexisting hypertension (gestational hypertension, preeclampsia, or chronic hypertension) Fetal growth restriction (your baby measures less than 10th percentile on ultrasound) Heavy vaginal bleeding  Non-reassuring fetal heart rate  Active infection (MRSA, etc.). Group B Strep is NOT a contraindication for waterbirth.  If your labor has to be induced and induction method requires continuous monitoring of the baby's heart rate  Other risks/issues identified by your obstetrical provider   Please remember that birth is unpredictable. Under certain unforeseeable circumstances your provider may advise against giving birth in the tub. These decisions will be made on a case-by-case basis and with the safety of you and your baby as our highest priority.   *Please remember that in order to have a waterbirth, you must test Negative to COVID-19 upon admission to the hospital.  Updated 09/03/20

## 2021-02-01 LAB — CBC
Hematocrit: 33 % — ABNORMAL LOW (ref 34.0–46.6)
Hemoglobin: 10.6 g/dL — ABNORMAL LOW (ref 11.1–15.9)
MCH: 26.6 pg (ref 26.6–33.0)
MCHC: 32.1 g/dL (ref 31.5–35.7)
MCV: 83 fL (ref 79–97)
Platelets: 250 10*3/uL (ref 150–450)
RBC: 3.98 x10E6/uL (ref 3.77–5.28)
RDW: 16.7 % — ABNORMAL HIGH (ref 11.7–15.4)
WBC: 9 10*3/uL (ref 3.4–10.8)

## 2021-02-04 LAB — CERVICOVAGINAL ANCILLARY ONLY
Bacterial Vaginitis (gardnerella): NEGATIVE
Candida Glabrata: NEGATIVE
Candida Vaginitis: NEGATIVE
Chlamydia: NEGATIVE
Comment: NEGATIVE
Comment: NEGATIVE
Comment: NEGATIVE
Comment: NEGATIVE
Comment: NEGATIVE
Comment: NORMAL
Neisseria Gonorrhea: NEGATIVE
Trichomonas: NEGATIVE

## 2021-02-05 LAB — CULTURE, BETA STREP (GROUP B ONLY): Strep Gp B Culture: NEGATIVE

## 2021-02-07 ENCOUNTER — Encounter: Payer: Self-pay | Admitting: Obstetrics and Gynecology

## 2021-02-07 ENCOUNTER — Other Ambulatory Visit: Payer: Self-pay

## 2021-02-07 ENCOUNTER — Ambulatory Visit (INDEPENDENT_AMBULATORY_CARE_PROVIDER_SITE_OTHER): Payer: BC Managed Care – PPO | Admitting: Obstetrics and Gynecology

## 2021-02-07 VITALS — BP 121/80 | HR 90 | Temp 98.0°F | Wt 213.0 lb

## 2021-02-07 DIAGNOSIS — Z3A37 37 weeks gestation of pregnancy: Secondary | ICD-10-CM

## 2021-02-07 DIAGNOSIS — Z3400 Encounter for supervision of normal first pregnancy, unspecified trimester: Secondary | ICD-10-CM

## 2021-02-07 DIAGNOSIS — Z0371 Encounter for suspected problem with amniotic cavity and membrane ruled out: Secondary | ICD-10-CM

## 2021-02-07 NOTE — Progress Notes (Signed)
   LOW-RISK PREGNANCY OFFICE VISIT Patient name: Sheri Lawrence MRN [id number]  Date of birth: Dec 02, 1995 Chief Complaint:   Routine Prenatal Visit  History of Present Illness:   Sheri Lawrence is a 25 y.o. G1P0 female at 28w5dwith an Estimated Date of Delivery: 02/23/21 being seen today for ongoing management of a low-risk pregnancy.  Today she reports occasional contractions and ? leaking fluid . Contractions: Not present. Vag. Bleeding: None.  Movement: Present. reports leaking of fluid. Review of Systems:   Pertinent items are noted in HPI Denies abnormal vaginal discharge w/ itching/odor/irritation, headaches, visual changes, shortness of breath, chest pain, abdominal pain, severe nausea/vomiting, or problems with urination or bowel movements unless otherwise stated above. Pertinent History Reviewed:  Reviewed past medical,surgical, social, obstetrical and family history.  Reviewed problem list, medications and allergies. Physical Assessment:   Vitals:   02/07/21 0855  BP: 121/80  Pulse: 90  Temp: 98 F (36.7 C)  Weight: 213 lb (96.6 kg)  Body mass index is 37.73 kg/m.        Physical Examination:   General appearance: Well appearing, and in no distress  Mental status: Alert, oriented to person, place, and time  Skin: Warm & dry  Cardiovascular: Normal heart rate noted  Respiratory: Normal respiratory effort, no distress  Abdomen: Soft, gravid, nontender  Pelvic: Cervical exam performed with chaperone, Tammy Pulliam, CMA resent  Negative Nitrizine  Extremities: Edema: None  Fetal Status: Fetal Heart Rate (bpm): 137 Fundal Height: 39 cm Movement: Present Presentation: Vertex  No results found for this or any previous visit (from the past 24 hour(s)).  Assessment & Plan:  1) Low-risk pregnancy G1P0 at 352w5dith an Estimated Date of Delivery: 02/23/21   2) Supervision of normal first pregnancy, antepartum  3) No leakage of amniotic fluid into vagina - Reassurance  given that Nitrizine swab negative for SROM  4) [redacted] weeks gestation of pregnancy    Meds: No orders of the defined types were placed in this encounter.  Labs/procedures today: Nitrizine swab & cervical exam  Plan:  Continue routine obstetrical care   Reviewed: Term labor symptoms and general obstetric precautions including but not limited to vaginal bleeding, contractions, leaking of fluid and fetal movement were reviewed in detail with the patient.  All questions were answered. Has home bp cuff. Check bp weekly, let usKoreanow if >140/90.   Follow-up: Return in about 1 week (around 02/14/2021) for Return OB visit.  No orders of the defined types were placed in this encounter.  RoLaury DeepSN, CNM 02/07/2021 9:10 AM

## 2021-02-08 ENCOUNTER — Telehealth (INDEPENDENT_AMBULATORY_CARE_PROVIDER_SITE_OTHER): Payer: BC Managed Care – PPO | Admitting: Certified Nurse Midwife

## 2021-02-08 DIAGNOSIS — M549 Dorsalgia, unspecified: Secondary | ICD-10-CM

## 2021-02-08 DIAGNOSIS — F5104 Psychophysiologic insomnia: Secondary | ICD-10-CM

## 2021-02-08 DIAGNOSIS — R252 Cramp and spasm: Secondary | ICD-10-CM

## 2021-02-08 DIAGNOSIS — O99891 Other specified diseases and conditions complicating pregnancy: Secondary | ICD-10-CM | POA: Insufficient documentation

## 2021-02-08 DIAGNOSIS — R2689 Other abnormalities of gait and mobility: Secondary | ICD-10-CM | POA: Diagnosis not present

## 2021-02-08 LAB — CYTOLOGY - PAP: Diagnosis: NEGATIVE

## 2021-02-08 MED ORDER — CYCLOBENZAPRINE HCL 10 MG PO TABS
10.0000 mg | ORAL_TABLET | Freq: Three times a day (TID) | ORAL | 1 refills | Status: DC | PRN
Start: 2021-02-08 — End: 2023-04-11

## 2021-02-08 MED ORDER — MAGNESIUM OXIDE -MG SUPPLEMENT 200 MG PO TABS: 400.0000 mg | ORAL_TABLET | Freq: Every day | ORAL | 3 refills | Status: DC

## 2021-02-08 MED ORDER — ACETAMINOPHEN 500 MG PO TABS: 1000.0000 mg | ORAL_TABLET | Freq: Four times a day (QID) | ORAL | 0 refills | Status: DC | PRN

## 2021-02-08 NOTE — Progress Notes (Signed)
OBSTETRICS VIRTUAL VISIT ENCOUNTER NOTE  Provider location: [facility] [place] [facility]  Patient location: Home  I connected with Sheri Lawrence on 02/08/21 at 10:55 AM EDT by MyChart Video Encounter and verified that I am speaking with the correct person using two identifiers. I discussed the limitations, risks, security and privacy concerns of performing an evaluation and management service virtually and the availability of in person appointments. I also discussed with the patient that there may be a patient responsible charge related to this service. The patient expressed understanding and agreed to proceed. Subjective:  Sheri Lawrence is a 25 y.o. G1P0 at 71w6dbeing seen today for evaluation of end-of-pregnancy complaints.  She is currently monitored for the following issues for this low-risk pregnancy and has Supervision of normal first pregnancy, antepartum and Anemia of mother in pregnancy, third trimester on their problem list.  Patient reports  low back pain, nightly leg cramps and generalized leg pain, insomnia, occasional ankle swelling and overall decreased mobility because of these symptoms. Her husband is having to help her up the stairs, shower, turn over in bed etc. These symptoms are very bothersome but she thought they were just part of pregnancy so did not complain at previous visits. She could not sleep much last night and her decreased rest/mobility is now affecting her mental health. Reports eating 2-3x/day, mostly light meals and "tries" to keep up with water intake .    The following portions of the patient's history were reviewed and updated as appropriate: allergies, current medications, past family history, past medical history, past social history, past surgical history and problem list.   Objective:  There were no vitals filed for this visit.  Fetal Status:     Movement: Present     General:  Alert, oriented and cooperative. Patient is in no  acute distress.  Respiratory: Normal respiratory effort, no problems with respiration noted  Mental Status: Normal mood and affect. Feeling sad/down because of inability to sleep well/move easily. Normal behavior. Normal judgment and thought content.  Rest of physical exam deferred due to type of encounter  Imaging: No results found.  Assessment and Plan:  Pregnancy: G1P0 at [redacted]w[redacted]d. Back pain affecting pregnancy in third trimester - Encouraged to begin wearing pregnancy support belt daily, as well as stretching several times per day. - Advised she can take Tylenol throughout the day for pain, flexeril sent to pharmacy for her to take at night which will also help the insomnia.  2. Leg cramps in pregnancy - Pt reported she never picked up the magnesium prescribed because the pharmacy was out, requested it be resent. - Magnesium sent to pharmacy. Advised it will also help with the restless leg symptoms and generalized pain  3. Psychophysiological insomnia - Advised to stretch before bed and take flexeril and magnesium  4. Decreased mobility - Advised she can go out of work until her postpartum recovery is finished due to her decreased mobility and difficulties sleeping/moving around the house.  - Confirmed pt is taking her iron/vitamin C to improve her anemia since that can add to her pain symptoms.  I discussed the assessment and treatment plan with the patient. The patient was provided an opportunity to ask questions and all were answered. The patient agreed with the plan and demonstrated an understanding of the instructions. The patient was advised to call back or seek an in-person office evaluation/[facility] concerning [facility]. Please refer  to After Visit Summary for other counseling recommendations.   I provided 15 minutes of face-to-face time during this encounter.  Future Appointments  Date Time Provider [place]   02/14/2021  8:55 AM Laury Deep, CNM [facility] None  02/21/2021  8:55 AM Laury Deep, CNM [facility] None  02/28/2021  9:55 AM Laury Deep, [place], [place] for [employer] [facility], [place]

## 2021-02-12 ENCOUNTER — Other Ambulatory Visit: Payer: Self-pay

## 2021-02-12 ENCOUNTER — Inpatient Hospital Stay (HOSPITAL_COMMUNITY)
Admission: AD | Admit: 2021-02-12 | Discharge: 2021-02-14 | DRG: 807 | Disposition: A | Discharge status: Home / Self Care | Payer: BC Managed Care – PPO | Attending: Obstetrics and Gynecology | Admitting: Obstetrics and Gynecology

## 2021-02-12 ENCOUNTER — Encounter (HOSPITAL_COMMUNITY): Payer: Self-pay | Admitting: Obstetrics and Gynecology

## 2021-02-12 DIAGNOSIS — D259 Leiomyoma of uterus, unspecified: Secondary | ICD-10-CM | POA: Diagnosis present

## 2021-02-12 DIAGNOSIS — D509 Iron deficiency anemia, unspecified: Secondary | ICD-10-CM | POA: Diagnosis present

## 2021-02-12 DIAGNOSIS — O4292 Full-term premature rupture of membranes, unspecified as to length of time between rupture and onset of labor: Principal | ICD-10-CM | POA: Diagnosis present

## 2021-02-12 DIAGNOSIS — O99013 Anemia complicating pregnancy, third trimester: Secondary | ICD-10-CM | POA: Diagnosis present

## 2021-02-12 DIAGNOSIS — M25472 Effusion, left ankle: Secondary | ICD-10-CM | POA: Diagnosis not present

## 2021-02-12 DIAGNOSIS — O9902 Anemia complicating childbirth: Secondary | ICD-10-CM | POA: Diagnosis present

## 2021-02-12 DIAGNOSIS — Z3400 Encounter for supervision of normal first pregnancy, unspecified trimester: Secondary | ICD-10-CM

## 2021-02-12 DIAGNOSIS — O4202 Full-term premature rupture of membranes, onset of labor within 24 hours of rupture: Secondary | ICD-10-CM | POA: Diagnosis not present

## 2021-02-12 DIAGNOSIS — O3413 Maternal care for benign tumor of corpus uteri, third trimester: Secondary | ICD-10-CM | POA: Diagnosis present

## 2021-02-12 DIAGNOSIS — M25471 Effusion, right ankle: Secondary | ICD-10-CM | POA: Diagnosis not present

## 2021-02-12 DIAGNOSIS — O99893 Other specified diseases and conditions complicating puerperium: Secondary | ICD-10-CM | POA: Diagnosis not present

## 2021-02-12 DIAGNOSIS — Z20822 Contact with and (suspected) exposure to covid-19: Secondary | ICD-10-CM | POA: Diagnosis present

## 2021-02-12 DIAGNOSIS — O26893 Other specified pregnancy related conditions, third trimester: Secondary | ICD-10-CM | POA: Diagnosis present

## 2021-02-12 DIAGNOSIS — D219 Benign neoplasm of connective and other soft tissue, unspecified: Secondary | ICD-10-CM | POA: Diagnosis present

## 2021-02-12 DIAGNOSIS — Z3A38 38 weeks gestation of pregnancy: Secondary | ICD-10-CM

## 2021-02-12 HISTORY — DX: Anemia, unspecified: D64.9

## 2021-02-12 HISTORY — DX: Anxiety disorder, unspecified: F41.9

## 2021-02-12 LAB — CBC
HCT: 34.6 % — ABNORMAL LOW (ref 36.0–46.0)
Hemoglobin: 10.7 g/dL — ABNORMAL LOW (ref 12.0–15.0)
MCH: 26.5 pg (ref 26.0–34.0)
MCHC: 30.9 g/dL (ref 30.0–36.0)
MCV: 85.6 fL (ref 80.0–100.0)
Platelets: 268 10*3/uL (ref 150–400)
RBC: 4.04 MIL/uL (ref 3.87–5.11)
RDW: 17 % — ABNORMAL HIGH (ref 11.5–15.5)
WBC: 9.2 10*3/uL (ref 4.0–10.5)
nRBC: 0 % (ref 0.0–0.2)

## 2021-02-12 LAB — TYPE AND SCREEN
ABO/RH(D): A POS
Antibody Screen: NEGATIVE

## 2021-02-12 LAB — RESP PANEL BY RT-PCR (FLU A&B, COVID) ARPGX2
Influenza A by PCR: NEGATIVE
Influenza B by PCR: NEGATIVE
SARS Coronavirus 2 by RT PCR: NEGATIVE

## 2021-02-12 LAB — POCT FERN TEST: POCT Fern Test: POSITIVE

## 2021-02-12 LAB — RPR: RPR Ser Ql: NONREACTIVE

## 2021-02-12 MED ORDER — DIPHENHYDRAMINE HCL 25 MG PO CAPS: 25.0000 mg | ORAL_CAPSULE | Freq: Four times a day (QID) | ORAL | Status: DC | PRN

## 2021-02-12 MED ORDER — EPHEDRINE 5 MG/ML INJ: 10.0000 mg | INTRAVENOUS | Status: DC | PRN

## 2021-02-12 MED ORDER — OXYCODONE HCL 5 MG PO TABS: 5.0000 mg | ORAL_TABLET | ORAL | Status: DC | PRN

## 2021-02-12 MED ORDER — MISOPROSTOL 50MCG HALF TABLET
50.0000 ug | ORAL_TABLET | ORAL | Status: DC
  Administered 2021-02-12: 50 ug via BUCCAL

## 2021-02-12 MED ORDER — PHENYLEPHRINE 40 MCG/ML (10ML) SYRINGE FOR IV PUSH (FOR BLOOD PRESSURE SUPPORT): 80.0000 ug | PREFILLED_SYRINGE | INTRAVENOUS | Status: DC | PRN

## 2021-02-12 MED ORDER — LACTATED RINGERS IV SOLN: 500.0000 mL | Freq: Once | INTRAVENOUS | Status: DC

## 2021-02-12 MED ORDER — PRENATAL MULTIVITAMIN CH
1.0000 | ORAL_TABLET | Freq: Every day | ORAL | Status: DC
  Administered 2021-02-13 – 2021-02-14 (×2): 1 via ORAL
  Filled 2021-02-12 (×2): qty 1

## 2021-02-12 MED ORDER — ONDANSETRON HCL 4 MG/2ML IJ SOLN: 4.0000 mg | INTRAMUSCULAR | Status: DC | PRN

## 2021-02-12 MED ORDER — ZOLPIDEM TARTRATE 5 MG PO TABS: 5.0000 mg | ORAL_TABLET | Freq: Every evening | ORAL | Status: DC | PRN

## 2021-02-12 MED ORDER — MISOPROSTOL 50MCG HALF TABLET
ORAL_TABLET | ORAL | Status: AC
  Filled 2021-02-12: qty 1

## 2021-02-12 MED ORDER — OXYTOCIN-SODIUM CHLORIDE 30-0.9 UT/500ML-% IV SOLN
2.5000 [IU]/h | INTRAVENOUS | Status: DC
  Filled 2021-02-12: qty 500

## 2021-02-12 MED ORDER — TETANUS-DIPHTH-ACELL PERTUSSIS 5-2.5-18.5 LF-MCG/0.5 IM SUSY: 0.5000 mL | PREFILLED_SYRINGE | Freq: Once | INTRAMUSCULAR | Status: DC

## 2021-02-12 MED ORDER — OXYCODONE-ACETAMINOPHEN 5-325 MG PO TABS: 1.0000 | ORAL_TABLET | ORAL | Status: DC | PRN

## 2021-02-12 MED ORDER — FLEET ENEMA 7-19 GM/118ML RE ENEM: 1.0000 | ENEMA | RECTAL | Status: DC | PRN

## 2021-02-12 MED ORDER — LACTATED RINGERS IV SOLN: 500.0000 mL | INTRAVENOUS | Status: DC | PRN

## 2021-02-12 MED ORDER — WITCH HAZEL-GLYCERIN EX PADS: 1.0000 "application " | MEDICATED_PAD | CUTANEOUS | Status: DC | PRN

## 2021-02-12 MED ORDER — SOD CITRATE-CITRIC ACID 500-334 MG/5ML PO SOLN: 30.0000 mL | ORAL | Status: DC | PRN

## 2021-02-12 MED ORDER — MEASLES, MUMPS & RUBELLA VAC IJ SOLR: 0.5000 mL | Freq: Once | INTRAMUSCULAR | Status: DC

## 2021-02-12 MED ORDER — OXYTOCIN 10 UNIT/ML IJ SOLN: 10.0000 [IU] | Freq: Once | INTRAMUSCULAR | Status: DC

## 2021-02-12 MED ORDER — FENTANYL CITRATE (PF) 100 MCG/2ML IJ SOLN: 50.0000 ug | INTRAMUSCULAR | Status: DC | PRN

## 2021-02-12 MED ORDER — OXYCODONE-ACETAMINOPHEN 5-325 MG PO TABS: 2.0000 | ORAL_TABLET | ORAL | Status: DC | PRN

## 2021-02-12 MED ORDER — ONDANSETRON HCL 4 MG PO TABS: 4.0000 mg | ORAL_TABLET | ORAL | Status: DC | PRN

## 2021-02-12 MED ORDER — ACETAMINOPHEN 325 MG PO TABS: 650.0000 mg | ORAL_TABLET | ORAL | Status: DC | PRN

## 2021-02-12 MED ORDER — SIMETHICONE 80 MG PO CHEW: 80.0000 mg | CHEWABLE_TABLET | ORAL | Status: DC | PRN

## 2021-02-12 MED ORDER — DIPHENHYDRAMINE HCL 50 MG/ML IJ SOLN
12.5000 mg | INTRAMUSCULAR | Status: DC | PRN
Start: 2021-02-12 — End: 2021-02-12

## 2021-02-12 MED ORDER — HYDROXYZINE HCL 50 MG PO TABS: 50.0000 mg | ORAL_TABLET | Freq: Four times a day (QID) | ORAL | Status: DC | PRN

## 2021-02-12 MED ORDER — LIDOCAINE HCL (PF) 1 % IJ SOLN: 30.0000 mL | INTRAMUSCULAR | Status: DC | PRN

## 2021-02-12 MED ORDER — IBUPROFEN 600 MG PO TABS
600.0000 mg | ORAL_TABLET | Freq: Four times a day (QID) | ORAL | Status: DC
  Administered 2021-02-12 – 2021-02-14 (×8): 600 mg via ORAL
  Filled 2021-02-12 (×7): qty 1

## 2021-02-12 MED ORDER — TERBUTALINE SULFATE 1 MG/ML IJ SOLN: 0.2500 mg | Freq: Once | INTRAMUSCULAR | Status: DC | PRN

## 2021-02-12 MED ORDER — MISOPROSTOL 50MCG HALF TABLET: 50.0000 ug | ORAL_TABLET | ORAL | Status: DC

## 2021-02-12 MED ORDER — SENNOSIDES-DOCUSATE SODIUM 8.6-50 MG PO TABS
2.0000 | ORAL_TABLET | ORAL | Status: DC
  Administered 2021-02-13 – 2021-02-14 (×2): 2 via ORAL
  Filled 2021-02-12 (×2): qty 2

## 2021-02-12 MED ORDER — ONDANSETRON HCL 4 MG/2ML IJ SOLN
4.0000 mg | Freq: Four times a day (QID) | INTRAMUSCULAR | Status: DC | PRN
  Administered 2021-02-12: 4 mg via INTRAVENOUS
  Filled 2021-02-12: qty 2

## 2021-02-12 MED ORDER — DIBUCAINE (PERIANAL) 1 % EX OINT: 1.0000 "application " | TOPICAL_OINTMENT | CUTANEOUS | Status: DC | PRN

## 2021-02-12 MED ORDER — OXYTOCIN 10 UNIT/ML IJ SOLN
INTRAMUSCULAR | Status: AC
  Administered 2021-02-12: 10 [IU]
  Filled 2021-02-12: qty 1

## 2021-02-12 MED ORDER — COCONUT OIL OIL
1.0000 "application " | TOPICAL_OIL | Status: DC | PRN
  Administered 2021-02-13: 1 via TOPICAL

## 2021-02-12 MED ORDER — OXYTOCIN BOLUS FROM INFUSION: 333.0000 mL | Freq: Once | INTRAVENOUS | Status: DC

## 2021-02-12 MED ORDER — LACTATED RINGERS IV SOLN: INTRAVENOUS | Status: DC

## 2021-02-12 MED ORDER — BENZOCAINE-MENTHOL 20-0.5 % EX AERO: 1.0000 "application " | INHALATION_SPRAY | CUTANEOUS | Status: DC | PRN

## 2021-02-12 MED ORDER — FENTANYL-BUPIVACAINE-NACL 0.5-0.125-0.9 MG/250ML-% EP SOLN
12.0000 mL/h | EPIDURAL | Status: DC | PRN
Start: 2021-02-12 — End: 2021-02-12

## 2021-02-12 NOTE — H&P (Signed)
Sheri Lawrence is a 25 y.o. female G1P0 with IUP at [redacted]w[redacted]d by early US/LMP presenting for water breaking around 0200.  Since then, she has started to feel ctx every few minutes, seem to be getting stronger.  Plans waterbirth. She reports positive fetal movement. She denies leakage of fluid or vaginal bleeding.  Prenatal History/Complications: PNC at [facility] Pregnancy complications:  - Past Medical History: Past Medical History:  Diagnosis Date   Depression    currently in therapy   Fibroid    UTI (urinary tract infection)     Past Surgical History: Past Surgical History:  Procedure Laterality Date   TONSILLECTOMY      Obstetrical History: OB History     Gravida  1   Para      Term      Preterm      AB      Living         SAB      IAB      Ectopic      Multiple      Live Births               Social History: Social History   Socioeconomic History   Marital status: Significant Other    Spouse name: Not on file   Number of children: Not on file   Years of education: Not on file   Highest education level: Bachelor's degree (e.g., BA, AB, BS)  Occupational History   Not on file  Tobacco Use   Smoking status: Never   Smokeless tobacco: Never  Vaping Use   Vaping Use: Never used  Substance and Sexual Activity   Alcohol use: Not Currently   Drug use: Never   Sexual activity: Not Currently    Birth control/protection: None  Other Topics Concern   Not on file  Social History Narrative   Not on file   Social Determinants of Health   Financial Resource Strain: Not on file  Food Insecurity: Not on file  Transportation Needs: Not on file  Physical Activity: Not on file  Stress: Not on file  Social Connections: Not on file    Family History: Family History  Problem Relation Age of Onset   Healthy Mother    Other Father 56       "died in his sleep"    Allergies: No Known Allergies  Medications Prior to Admission  Medication Sig  Dispense Refill Last Dose   acetaminophen (TYLENOL) 500 MG tablet Take 2 tablets (1,000 mg total) by mouth every 6 (six) hours as needed. 30 tablet 0 Past Month   cyclobenzaprine (FLEXERIL) 10 MG tablet Take 1 tablet (10 mg total) by mouth every 8 (eight) hours as needed for muscle spasms. 30 tablet 1 02/11/2021   ferrous sulfate 325 (65 FE) MG EC tablet Take 1 tablet (325 mg total) by mouth every other day. 45 tablet 2 02/11/2021   Magnesium Oxide (MAG-OXIDE) 200 MG TABS Take 2 tablets (400 mg total) by mouth at bedtime. If that amount causes loose stools in the am, switch to 200mg  daily at bedtime. 60 tablet 3 02/11/2021   Prenatal Vit-Fe Fumarate-FA (PRENATAL PO) Take 2 tablets by mouth daily. Prenatal Gummies   02/11/2021   ondansetron (ZOFRAN-ODT) 4 MG disintegrating tablet Take 1 tablet (4 mg total) by mouth every 6 (six) hours as needed for nausea or vomiting. 10 tablet 0     Review of Systems   Constitutional: Negative for fever and chills  Eyes: Negative for visual disturbances Respiratory: Negative for shortness of breath, dyspnea Cardiovascular: Negative for chest pain or palpitations  Gastrointestinal: Negative for vomiting, diarrhea and constipation.  POSITIVE for abdominal pain (contractions) Genitourinary: Negative for dysuria and urgency Musculoskeletal: Negative for back pain, joint pain, myalgias  Neurological: Negative for dizziness and headaches  Blood pressure 121/74, pulse 97, temperature (!) 97.3 F (36.3 C), temperature source Oral, resp. rate 16, height 5\' 3"  (1.6 m), weight 97.1 kg, last menstrual period 05/19/2020, SpO2 100 %. General appearance: alert, cooperative, and mild distress Lungs: normal respiratory effort Heart: regular rate and rhythm Abdomen: soft, non-tender; bowel sounds normal Extremities: Homans sign is negative, no sign of DVT DTR's 2+ Presentation: cephalic Fetal monitoring  Baseline: 140 bpm, Variability: Good {> 6 bpm), Accelerations: Reactive,  and Decelerations: Absent Uterine activity  mild, about q 3 minutes     Prenatal labs: ABO, Rh: A/Positive/-- (03/30 1059) Antibody: Negative (03/30 1059) Rubella: 1.63 (03/30 1059) RPR: Non Reactive (07/14 0818)  HBsAg: Negative (03/30 1059)  HIV: Non Reactive (07/14 0818)  GBS: Negative/-- (09/08 0920)   Nursing Staff Provider  Office Location [facility] Dating  LMP/1st trimester [place]   Language  English Anatomy US  normal  Flu Vaccine   Genetic/Carrier Screen  NIPS: Declined    AFP: Declined Horizon: Declined  TDaP Vaccine   12/06/2020 Hgb A1C or  GTT Early 5.2 Third trimester normal  COVID Vaccine Completed   LAB RESULTS   Rhogam  N/A Blood Type A/Positive/-- (03/30 1059)   Baby Feeding Plan Breast Antibody Negative (03/30 1059)  Contraception None Rubella 1.63 (03/30 1059) IMMUNE  Circumcision Yes RPR Non Reactive (03/30 1059)   Pediatrician  Info given HBsAg Negative (03/30 1059)   Support Person FOB - Stephen HCVAb Negative   Prenatal Classes Info given HIV Non Reactive (03/30 1059)     BTL Consent N/A GBS  Neagtive (For PCN allergy, check sensitivities)   VBAC Consent N/A Pap Normal (01/31/21)       BP Cuff Give at next visit Waterbirth  [x]  Class [x]  Consent [x]  CNM visit    Induction  [ ]  Orders Entered [ ] Foley Y/N   Prenatal Transfer Tool  Maternal Diabetes: No Genetic Screening: Declined Maternal Ultrasounds/Referrals: Normal Fetal Ultrasounds or other Referrals:  None Maternal Substance Abuse:  No Significant Maternal Medications:  None Significant Maternal Lab Results: Group B Strep negative  Results for orders placed or performed during the hospital encounter of 02/12/21 (from the past 24 hour(s))  POCT fern test   Collection Time: 02/12/21  3:06 AM  Result Value Ref Range   POCT Fern Test Positive = ruptured amniotic membanes     Assessment: Sheri Lawrence is a 25 y.o. G1P0 with an IUP at [redacted]w[redacted]d presenting for ROM/early labor  Plan: #Labor:  discussed oral cytotec, wishes to wait since ctx have now started>expectant management #Pain:  Per request #FWB Cat 1    Christin Fudge 02/12/2021, 3:26 AM

## 2021-02-12 NOTE — Discharge Summary (Addendum)
Postpartum Discharge Summary     Patient Name: Sheri Lawrence DOB: [birth year] MRN: [id number]  Date of admission: 02/12/2021 Delivery date:02/12/2021  Delivering provider: Serita Grammes D  Date of discharge: 02/14/2021  Admitting diagnosis: Indication for care in labor and delivery, antepartum [O75.9] Intrauterine pregnancy: [redacted]w[redacted]d    Secondary diagnosis:   Iron deficiency anemia Fibroids  Additional problems: none    Discharge diagnosis: Term Pregnancy Delivered                                              Post partum procedures: none Augmentation: Cytotec Complications: None  Hospital course:  Induction of Labor With Vaginal Delivery      25y.o. yo G1P0 at 372w3das admitted with PROM on 02/12/2021 at midnight. Patient had an uncomplicated labor course, desiring expectant management initially and then when no labor occurred she received a dose of buccal cytotec in the morning which put her into labor; she had a vag del in the tub without complication. Membrane Rupture Time/Date: 12:00 AM ,02/12/2021   Delivery Method:Vaginal, Spontaneous  Episiotomy: None  Lacerations:  None  Patient had an uncomplicated postpartum course.  She is ambulating, tolerating a regular diet, passing flatus, and urinating well. Patient is discharged home in stable condition on 02/14/21.  Newborn Data: Birth date:02/12/2021  Birth time:6:05 PM  Gender:Female  Living status:Living  Apgars:9 ,9  Weight:2934 g (6lb 7.5oz)  Magnesium Sulfate received: No BMZ received: No Rhophylac:N/A MMR:N/A T-DaP:Given prenatally Flu: No Transfusion:No  Physical exam  Vitals:   02/13/21 0131 02/13/21 0518 02/13/21 0700 02/13/21 1426  BP: 108/65 113/82 102/65 91/72  Pulse: 94 (!) 105 100 (!) 107  Resp: '18 18 17 16  ' Temp: 98.2 F (36.8 C)  98.4 F (36.9 C) 98.2 F (36.8 C)  TempSrc:   Oral Oral  SpO2: 100% 100% 99% 100%  Weight:      Height:       General: alert, cooperative, and no  distress Lochia: appropriate Uterine Fundus: firm DVT Evaluation: No evidence of DVT seen on physical exam. Labs: Lab Results  Component Value Date   WBC 9.2 02/12/2021   HGB 10.7 (L) 02/12/2021   HCT 34.6 (L) 02/12/2021   MCV 85.6 02/12/2021   PLT 268 02/12/2021   CMP Latest Ref Rng & Units 09/26/2020  Glucose 65 - 99 mg/dL 120(H)  BUN 6 - 20 mg/dL 6  Creatinine 0.57 - 1.00 mg/dL 0.50(L)  Sodium 134 - 144 mmol/L 135  Potassium 3.5 - 5.2 mmol/L 3.9  Chloride 96 - 106 mmol/L 99  CO2 20 - 29 mmol/L 19(L)  Calcium 8.7 - 10.2 mg/dL 9.2  Total Protein 6.0 - 8.5 g/dL 6.9  Total Bilirubin 0.0 - 1.2 mg/dL <0.2  Alkaline Phos 44 - 121 IU/L 44  AST 0 - 40 IU/L 13  ALT 0 - 32 IU/L 8   Edinburgh Score: No flowsheet data found.   After visit meds:  Allergies as of 02/14/2021   No Known Allergies      Medication List     STOP taking these medications    ondansetron 4 MG disintegrating tablet Commonly known as: ZOFRAN-ODT       TAKE these medications    acetaminophen 325 MG tablet Commonly known as: Tylenol Take 2 tablets (650 mg total) by mouth every 4 (four) hours as needed (  for pain scale < 4). What changed:  medication strength how much to take when to take this reasons to take this   cyclobenzaprine 10 MG tablet Commonly known as: FLEXERIL Take 1 tablet (10 mg total) by mouth every 8 (eight) hours as needed for muscle spasms.   ferrous sulfate 325 (65 FE) MG EC tablet Take 1 tablet (325 mg total) by mouth every other day.   ibuprofen 200 MG tablet Commonly known as: ADVIL Take 3 tablets (600 mg total) by mouth every 6 (six) hours.   Magnesium Oxide 200 MG Tabs Commonly known as: Mag-Oxide Take 2 tablets (400 mg total) by mouth at bedtime. If that amount causes loose stools in the am, switch to 210m daily at bedtime.   PRENATAL PO Take 2 tablets by mouth daily. Prenatal Gummies         Discharge home in stable condition Infant Feeding:  Breast Infant Disposition:home with mother Discharge instruction: per After Visit Summary and Postpartum booklet. Activity: Advance as tolerated. Pelvic rest for 6 weeks.  Diet: routine diet Future Appointments: Future Appointments  Date Time Provider [place] 03/21/2021 10:35 AM DLaury Deep CNM [facility] None   Follow up Visit: SMyrtis Ser CNM  P [facility] Please schedule this patient for Postpartum visit in: 4 weeks with the following provider: Any provider  Virtual or in-person> her choice  For C/S patients schedule nurse incision check in weeks 2 weeks: no  Low risk pregnancy complicated by: none  Delivery mode:  SVD  Anticipated Birth Control:  Condoms  PP Procedures needed: none  Schedule Integrated BBroadviewvisit: no   02/14/2021 BPearla Dubonnet MD  CNM attestation I have seen and examined this patient and agree with above documentation in the resident's note.   Sheri Lawrence a 25y.o. G1P1001 s/p vag del.   Pain is well controlled.  Plan for birth control is condoms.  Method of Feeding: breast  PE:  BP 91/72 (BP Location: Left Arm)   Pulse (!) 107   Temp 98.2 F (36.8 C) (Oral)   Resp 16   Ht '5\' 3"'  (1.6 m)   Wt 97.1 kg   LMP 05/19/2020   SpO2 100%   Breastfeeding Unknown   BMI 37.91 kg/m  Fundus firm  Recent Labs    02/12/21 0320  HGB 10.7*  HCT 34.6*     Plan: discharge today - postpartum care discussed - f/u clinic in 4 weeks for postpartum visit   KMyrtis Ser CNM 9:34 AM 02/14/2021

## 2021-02-12 NOTE — Progress Notes (Signed)
Patient ID: Sheri Lawrence, female   DOB: 1996-03-06, 25 y.o.   MRN: [id number]  Feeling more pressure  VSS, afeb FHR 120s, +accels, occ variables Ctx q 2-3 mins, now spont Cx 6/C/vtx -1  IUP@term  Active labor  Plans to get in tub now; water filling Anticipate vag del  [name] [facility] 02/12/2021 2:24 PM

## 2021-02-12 NOTE — Progress Notes (Addendum)
History   [id number]   Chief Complaint  Patient presents with   Rupture of Membranes    HPI Sheri Lawrence is a 25 y.o. female  G1P0 @38 .3 wks here with report of gush of clear fluid around 0045.  Leaking of fluid has continued. Pt reports contractions q10 min. She denies vaginal bleeding. She reports + fetal movement. All other systems negative.    Patient's last menstrual period was 05/19/2020.  OB History  Gravida Para Term Preterm AB Living  1            SAB IAB Ectopic Multiple Live Births               # Outcome Date GA Lbr Len/2nd Weight Sex Delivery Anes PTL Lv  1 Current             Past Medical History:  Diagnosis Date   Depression    currently in therapy   Fibroid    UTI (urinary tract infection)     Family History  Problem Relation Age of Onset   Healthy Mother    Other Father 25       "died in his sleep"    Social History   Socioeconomic History   Marital status: Significant Other    Spouse name: Not on file   Number of children: Not on file   Years of education: Not on file   Highest education level: Bachelor's degree (e.g., BA, AB, BS)  Occupational History   Not on file  Tobacco Use   Smoking status: Never   Smokeless tobacco: Never  Vaping Use   Vaping Use: Never used  Substance and Sexual Activity   Alcohol use: Not Currently   Drug use: Never   Sexual activity: Not Currently    Birth control/protection: None  Other Topics Concern   Not on file  Social History Narrative   Not on file   Social Determinants of Health   Financial Resource Strain: Not on file  Food Insecurity: Not on file  Transportation Needs: Not on file  Physical Activity: Not on file  Stress: Not on file  Social Connections: Not on file    No Known Allergies  No current facility-administered medications on file prior to encounter.   Current Outpatient Medications on File Prior to Encounter  Medication Sig Dispense Refill   acetaminophen (TYLENOL)  500 MG tablet Take 2 tablets (1,000 mg total) by mouth every 6 (six) hours as needed. 30 tablet 0   cyclobenzaprine (FLEXERIL) 10 MG tablet Take 1 tablet (10 mg total) by mouth every 8 (eight) hours as needed for muscle spasms. 30 tablet 1   ferrous sulfate 325 (65 FE) MG EC tablet Take 1 tablet (325 mg total) by mouth every other day. 45 tablet 2   Magnesium Oxide (MAG-OXIDE) 200 MG TABS Take 2 tablets (400 mg total) by mouth at bedtime. If that amount causes loose stools in the am, switch to 200mg  daily at bedtime. 60 tablet 3   Prenatal Vit-Fe Fumarate-FA (PRENATAL PO) Take 2 tablets by mouth daily. Prenatal Gummies     ondansetron (ZOFRAN-ODT) 4 MG disintegrating tablet Take 1 tablet (4 mg total) by mouth every 6 (six) hours as needed for nausea or vomiting. 10 tablet 0     Review of Systems  Gastrointestinal: Negative.   Genitourinary:  Positive for vaginal discharge. Negative for vaginal bleeding.    Physical Exam   Vitals:   02/12/21 0145  BP: 121/74  Pulse:  97  Resp: 16  Temp: (!) 97.3 F (36.3 C)  TempSrc: Oral  SpO2: 100%  Weight: 97.1 kg  Height: 5\' 3"  (1.6 m)    Physical Exam Vitals and nursing note reviewed. Exam conducted with a chaperone present.  Constitutional:      General: She is not in acute distress.    Appearance: Normal appearance.  HENT:     Head: Normocephalic and atraumatic.  Cardiovascular:     Rate and Rhythm: Normal rate.  Pulmonary:     Effort: Pulmonary effort is normal. No respiratory distress.  Genitourinary:    Comments: SSE: +pool, fern pos SVE: FT/50/vtx Musculoskeletal:        General: Normal range of motion.     Cervical back: Normal range of motion.  Skin:    General: Skin is warm and dry.  Neurological:     General: No focal deficit present.     Mental Status: She is alert and oriented to person, place, and time.  Psychiatric:        Mood and Affect: Mood normal.        Behavior: Behavior normal.  EFM: 140 bpm, mod  variability, + accels, no decels Toco: irregular  Results for orders placed or performed during the hospital encounter of 02/12/21 (from the past 24 hour(s))  POCT fern test     Status: Abnormal   Collection Time: 02/12/21  3:06 AM  Result Value Ref Range   POCT Fern Test Positive = ruptured amniotic membanes    MAU Course  Procedures  MDM SROM confirmed. Plan for admit.  Assessment and Plan  [redacted] weeks gestation Reactive NST PROM at term Admit to LD Mngt per labor team  Julianne Handler, CNM 02/12/2021 3:04 AM

## 2021-02-12 NOTE — Plan of Care (Signed)
  Problem: Education: Goal: Knowledge of Childbirth will improve Outcome: Progressing Goal: Ability to make informed decisions regarding treatment and plan of care will improve Outcome: Progressing Goal: Ability to state and carry out methods to decrease the pain will improve Outcome: Progressing Goal: Individualized Educational Video(s) Outcome: Progressing   Problem: Coping: Goal: Ability to verbalize concerns and feelings about labor and delivery will improve Outcome: Progressing   Problem: Life Cycle: Goal: Ability to make normal progression through stages of labor will improve Outcome: Progressing Goal: Ability to effectively push during vaginal delivery will improve Outcome: Progressing   Problem: Role Relationship: Goal: Will demonstrate positive interactions with the child Outcome: Progressing   Problem: Safety: Goal: Risk of complications during labor and delivery will decrease Outcome: Progressing   Problem: Pain Management: Goal: Relief or control of pain from uterine contractions will improve Outcome: Progressing   Problem: Health Behavior/Discharge Planning: Goal: Ability to manage health-related needs will improve Outcome: Progressing   Problem: Clinical Measurements: Goal: Ability to maintain clinical measurements within normal limits will improve Outcome: Progressing Goal: Will remain free from infection Outcome: Progressing Goal: Diagnostic test results will improve Outcome: Progressing Goal: Respiratory complications will improve Outcome: Progressing Goal: Cardiovascular complication will be avoided Outcome: Progressing   Problem: Nutrition: Goal: Adequate nutrition will be maintained Outcome: Progressing   Problem: Elimination: Goal: Will not experience complications related to bowel motility Outcome: Progressing Goal: Will not experience complications related to urinary retention Outcome: Progressing   Problem: Skin Integrity: Goal: Risk  for impaired skin integrity will decrease Outcome: Progressing

## 2021-02-12 NOTE — Progress Notes (Signed)
Patient ID: Sheri Lawrence, female   DOB: 02-12-1996, 25 y.o.   MRN: [id number]  Feeling more pressure- requesting exam; just got out of tub  VSS, afeb FHR 130s, via intermittent monitoring protocol Ctx q 3 mins Cx 9/C/0/+1  IUP@term  Active labor/transition  Wants to get back in tub; water drained and refilled per temperature protocol Anticipate vag del  [name] CNM 02/12/2021 4:41 PM

## 2021-02-12 NOTE — Progress Notes (Signed)
Patient ID: Sheri Lawrence, female   DOB: [birth year], 25 y.o.   MRN: [id number]  In to introduce myself to pt; she is reporting ctx which she feels mainly in her back with some low abd discomfort as well, that have become slightly stronger since her buccal cytotec dose at 0750  BP 112/59, 97 FHR 120s, +accels, occ mi variables Ctx irreg 2-5 mins Cx deferred at present (1+/80 with cytotec dose)  IUP@38 .3wks SROM x 11hrs Early labor?  Plan for a cx exam ~noon to determine plan Recommended H&Ks and other position changes for back discomfort  [name] CNM 02/12/2021 11:06 AM

## 2021-02-12 NOTE — Progress Notes (Signed)
Patient Vitals for the past 4 hrs:  BP Temp Temp src Pulse Resp  02/12/21 0733 (!) 112/59 -- -- 97 --  02/12/21 0701 109/65 -- -- (!) 106 19  02/12/21 0515 136/79 97.8 F (36.6 C) Oral (!) 109 19   Ctx are "getting stronger" but still mild.  Discussed infection risk with PROM, recommended Cytotec/breast pump. Pt agreeable.  Cx 1.5/80/-2. FHR Cat 1.

## 2021-02-12 NOTE — Lactation Note (Signed)
This note was copied from a baby's chart. Lactation Consultation Note  Patient Name: Sheri Lawrence Today's Date: 02/12/2021 Reason for consult: L&D Initial assessment;Early term 37-38.6wks;1st time breastfeeding Age:24 hours LC entered the room, mom was doing skin to skin with infant, infant was cuing to breastfeed. Mom did breast stimulation prior to latching infant at the breast. Mom latched infant on her left breast using the football hold position, infant sustained latch and breastfeed for 10 minutes. Afterwards mom did hand expression and infant was given 2 mls of colostrum by spoon. Mom knows to breastfeed infant by cues, 8 to 12+or more times within 24 hours, skin to skin.  Mom will ask RN/LC on MBU for further latch assistance if needed. Mom will pre-pump breast with hand pump prior to latching infant due to having flat nipples.  Maternal Data Has patient been taught Hand Expression?: Yes Does the patient have breastfeeding experience prior to this delivery?: No  Feeding Mother's Current Feeding Choice: Breast Milk  LATCH Score Latch: Grasps breast easily, tongue down, lips flanged, rhythmical sucking.  Audible Swallowing: Spontaneous and intermittent  Type of Nipple: Flat  Comfort (Breast/Nipple): Soft / non-tender  Hold (Positioning): Assistance needed to correctly position infant at breast and maintain latch.  LATCH Score: 8   Lactation Tools Discussed/Used Tools: Pump Breast pump type: Manual Pump Education: Setup, frequency, and cleaning;Milk Storage Reason for Pumping: To pre-pump breast prior to latching infant due to having flat nipples. Pumping frequency: Pre-pump prior to latching infant at the breast.  Interventions Interventions: Assisted with latch;Skin to skin;Adjust position;Support pillows;Breast compression;Position options;Hand pump;Expressed milk;Hand express;Pre-pump if needed;Education  Discharge Pump: Manual;Personal WIC Program:  Yes  Consult Status Consult Status: Follow-up Date: 02/13/21 Follow-up type: In-patient    Vicente Serene 02/12/2021, 8:09 PM

## 2021-02-12 NOTE — MAU Note (Signed)
Pt had a gush of fluid about an hour ago. Also feeling some cramping and unsure if contractions. +FM.

## 2021-02-12 NOTE — Progress Notes (Signed)
Patient ID: Sheri Lawrence, female   DOB: [birth year], 25 y.o.   MRN: [id number]  Feeling more uncomfortable w ctx; coping well  VSS, stable FHR 115-125, +accels, occ mi variables Ctx q 2-4 mins Cx 4/90/vtx -2  IUP@term  Early active labor  Manage expectantly for now; rec hold off on the tub for one more hour until labor is more established, and then get in tub; continue w position changes for now  [place] 02/12/2021 12:14 PM

## 2021-02-13 NOTE — Progress Notes (Signed)
Post Partum Day 1 Subjective: Eating, drinking, voiding, ambulating well.  +flatus.  Lochia and pain wnl.  Denies dizziness, lightheadedness, or sob. No complaints.   Objective: Blood pressure 102/65, pulse 100, temperature 98.4 F (36.9 C), temperature source Oral, resp. rate 17, height 5\' 3"  (1.6 m), weight 97.1 kg, last menstrual period 05/19/2020, SpO2 99 %, unknown if currently breastfeeding.  Physical Exam:  General: alert, cooperative, and no distress Lochia: appropriate Uterine Fundus: firm Incision: n/a DVT Evaluation: No evidence of DVT seen on physical exam. Negative Homan's sign. No cords or calf tenderness. No significant calf/ankle edema.  Recent Labs    02/12/21 0320  HGB 10.7*  HCT 34.6*    Assessment/Plan: Plan for discharge tomorrow, Breastfeeding, and Contraception condoms   LOS: 1 day   Roma Schanz 02/13/2021, 7:24 AM

## 2021-02-13 NOTE — Lactation Note (Signed)
This note was copied from a baby's chart. Lactation Consultation Note  Patient Name: Sheri Lawrence Today's Date: 02/13/2021 Reason for consult: Initial assessment;Early term 37-38.6wks Age:24 hours  Mom states positive breast changes.  Dad holding infant in blankets with baby cueing.  LC assisted with latching in laid back position.  Rolled blanket under mom's breast helped with support.  Baby open wide and latch well and began sucking but needing encouragement to continue sucking.    BF basics reviewed with mom. BFSG, OP services, and phone line resources shared with family.  All questions answered.  Mom will call out for further assistance.    Maternal Data Has patient been taught Hand Expression?: Yes  Feeding Mother's Current Feeding Choice: Breast Milk  LATCH Score Latch: Grasps breast easily, tongue down, lips flanged, rhythmical sucking.  Audible Swallowing: None  Type of Nipple: Everted at rest and after stimulation (slightly short shafted)  Comfort (Breast/Nipple): Soft / non-tender  Hold (Positioning): Assistance needed to correctly position infant at breast and maintain latch.  LATCH Score: 7   Lactation Tools Discussed/Used Breast pump type: Manual  Interventions Interventions: Breast feeding basics reviewed;Assisted with latch;Skin to skin;Breast massage;Hand express;Expressed milk;Position options;Education;Adjust position;Breast compression  Discharge Pump: Personal;Manual  Consult Status Consult Status: Follow-up Date: 02/14/21 Follow-up type: In-patient    Ferne Coe [facility] 02/13/2021, 8:36 AM

## 2021-02-13 NOTE — Social Work (Signed)
CSW received consult for hx of Depression.  CSW met with MOB to offer support and complete assessment.    CSW met with MOB at bedside and introduced CSW role. CSW congratulated MOB and FOB. CSW observed MOB STS with the infant and FOB resting at bedside (who later excited the room). MOB welcomed CSW to complete the assessment with FOB present. MOB presented calm and receptive to CSW. CSW inquired how MOB has felt since giving birth. MOB expressed feeling good and shared she has a great experience with the water birth. CSW praised MOB for her efforts. CSW inquired about MOB history of depression. MOB disclosed she started feeling depressed after the death of her brother in 2018, grandfather and a brother in 2019 and the death of her father last year. MOB reported during the pregnancy she experienced depression as evidenced by feeling very tearful and alone. MOB disclosed stressors during the pregnancy with FOB that contributed to her feeling alone. MOB reported her and FOB are doing better. MOB reported she is currently in therapy but has two more session. MOB shared the therapist has been proactive in helping to search for another therapist for ongoing treatment. MOB shared in therapy she has learned coping mechanisms, such as learning to communicate her feelings and emotions. MOB shared she also feels better after crying and talks to her mom for support. CSW inquired about additional supports. MOB identified FOB, community support through the [facility] and [employer] program. CSW encouraged MOB to reach out to her supports,if needed. CSW provided education regarding the baby blues period vs. perinatal mood disorders, discussed treatment and gave resources for mental health follow up if concerns arise.  CSW recommended MOB complete a self-evaluation during the postpartum time period using the New Mom Checklist from Postpartum Progress and encouraged MOB to contact a medical professional if  symptoms are noted at any time.  MOB shared she feels comfortable reaching out to her OBGYN if she has concerns. CSW assessed MOB for safety. MOB denied thoughts of harm to self and others. MOB denied domestic violence.   CSW provided review of Sudden Infant Death Syndrome (SIDS) precautions. MOB reported she has essential items for the infant including a bassinet where the infant will sleep. MOB is still deciding on pediatrician for infant's follow up care. CSW assessed MOB for additional needs. MOB reported no further need.   CSW identifies no further need for intervention and no barriers to discharge at this time.   Nicole Sinclair, MSW, LCSW [facility]  Clinical Social Worker  [phone] 02/13/2021  12:00 PM  

## 2021-02-13 NOTE — Progress Notes (Addendum)
Post Partum Day 1 Subjective: no complaints, up ad lib, voiding, tolerating PO, + flatus, and had an uncomplicated waterbirth. Endorsing bilateral ankle swelling, but says it is non-painful and is something that she dealt with intermittently throughout pregnancy.  Objective: Blood pressure 113/82, pulse (!) 105, temperature 98.2 F (36.8 C), resp. rate 18, height 5\' 3"  (1.6 m), weight 97.1 kg, last menstrual period 05/19/2020, SpO2 100 %, unknown if currently breastfeeding.  Physical Exam:  General: alert, cooperative, and no distress Lochia: appropriate Uterine Fundus: firm DVT Evaluation: 1+ pitting edema to ankles bilaterally, symmetric and without associated warmth or erythema. Negative calf-squeeze, no pain with dorsiflexion.   Recent Labs    02/12/21 0320  HGB 10.7*  HCT 34.6*    Assessment/Plan: Plan for discharge tomorrow and Breastfeeding Ankle swelling likely to be self-resolving. I am not concerned for DVT at this time. If it becomes more painful/bothersome, could consider spot-dosing PO Lasix.    LOS: 1 day   Pearla Dubonnet 02/13/2021, 7:11 AM   I spoke with and examined patient and agree with resident/PA-S/MS/SNM's note and plan of care.  Roma Schanz, CNM, [facility] 02/13/2021 8:15 AM

## 2021-02-14 ENCOUNTER — Encounter: Payer: BC Managed Care – PPO | Admitting: Obstetrics and Gynecology

## 2021-02-14 MED ORDER — ACETAMINOPHEN 325 MG PO TABS
650.0000 mg | ORAL_TABLET | ORAL | Status: DC | PRN
Start: 2021-02-14 — End: 2023-04-11

## 2021-02-14 MED ORDER — IBUPROFEN 200 MG PO TABS: 600.0000 mg | ORAL_TABLET | Freq: Four times a day (QID) | ORAL | Status: DC

## 2021-02-14 NOTE — Lactation Note (Signed)
This note was copied from a baby's chart. Lactation Consultation Note  Patient Name: Sheri Lawrence [id number] Date: 02/14/2021 Reason for consult: Follow-up assessment Age:24 hours  LC in to room prior to discharge. Discussed normal behavior at 48h and patterns, voids and stools, tummy size, in addition to clusterfeeding. Talked about milk coming into volume.  Mother has a manual pump, LC demonstrated use and reviewed hand expression. Talked about massaging breasts prior to pumping and breastfeeding.   Plan: 1-Aim for a deep, comfortable latch, feeding on demand or 8-12 times in 24h period. 2-Express or pump as needed for supplementation purposes 3-Encouraged maternal rest, hydration and food intake.   Contact LC as needed for feeds/support/concerns/questions. All questions answered at this time. Reviewed [employer] brochure and [employer] booklet.     Maternal Data Has patient been taught Hand Expression?: Yes Does the patient have breastfeeding experience prior to this delivery?: No  Feeding Mother's Current Feeding Choice: Breast Milk  Lactation Tools Discussed/Used Tools: Flanges;Pump Flange Size: 24 Breast pump type: Manual Pump Education: Setup, frequency, and cleaning;Milk Storage Reason for Pumping: mother's preference Pumping frequency: as needed Pumped volume:  (drops upon demonstration)  Interventions Interventions: Breast feeding basics reviewed;Skin to skin;Hand express;Breast massage;Education;Hand pump;Expressed milk;Coconut oil  Discharge Discharge Education: Engorgement and breast care;Warning signs for feeding baby Pump: Personal;Manual  Consult Status Consult Status: Complete Date: 02/13/21 Follow-up type: Call as needed    [place] 02/14/2021, 2:44 PM

## 2021-02-18 ENCOUNTER — Telehealth: Payer: Self-pay | Admitting: *Deleted

## 2021-02-18 NOTE — Telephone Encounter (Signed)
Patient called stating her case worker for disability needed more information regarding anemia and leg cramps/spasms. Case worker is needing the severity of each complaint. Please advise.   Derl Barrow, RN

## 2021-02-21 ENCOUNTER — Encounter: Payer: BC Managed Care – PPO | Admitting: Obstetrics and Gynecology

## 2021-02-23 ENCOUNTER — Telehealth (HOSPITAL_COMMUNITY): Payer: Self-pay

## 2021-02-23 NOTE — Telephone Encounter (Signed)
"  I'm doing good. I'm feeling good." Patient has no question or concerns about her healing.  "She's good. She is asleep right now. She is eating and gaining weight. She sleeps in her Bassinet." RN reviewed ABC's of safe sleep with patient. Patient declines any questions or concerns about baby.  EPDS score is 2.  Sharyn Lull [facility] 02/23/2021,1442

## 2021-02-28 ENCOUNTER — Encounter: Payer: BC Managed Care – PPO | Admitting: Obstetrics and Gynecology

## 2021-03-21 ENCOUNTER — Ambulatory Visit (INDEPENDENT_AMBULATORY_CARE_PROVIDER_SITE_OTHER): Payer: BC Managed Care – PPO | Admitting: Obstetrics and Gynecology

## 2021-03-21 ENCOUNTER — Other Ambulatory Visit: Payer: Self-pay

## 2021-03-21 NOTE — Progress Notes (Signed)
[place] Partum Visit Note  Sheri Lawrence is a 25 y.o. G19P1001 female who presents for a postpartum visit. She is 5 weeks postpartum following a normal spontaneous vaginal delivery.  I have fully reviewed the prenatal and intrapartum course. The delivery was at [redacted]w[redacted]d gestational weeks.  Anesthesia: none. Postpartum course has been good. Baby is doing well. Baby is feeding by both breast and bottle - Breast milk . Bleeding no bleeding. Bowel function is normal. Bladder function is normal. Patient is not sexually active. Contraception method is condoms. Postpartum depression screening: negative.   The pregnancy intention screening data noted above was reviewed. Potential methods of contraception were discussed. The patient elected to proceed with No data recorded.   Edinburgh Postnatal Depression Scale - 03/21/21 1038       Edinburgh Postnatal Depression Scale:  In the Past 7 Days   I have been able to laugh and see the funny side of things. 0    I have looked forward with enjoyment to things. 0    I have blamed myself unnecessarily when things went wrong. 0    I have been anxious or worried for no good reason. 0    I have felt scared or panicky for no good reason. 0    Things have been getting on top of me. 0    I have been so unhappy that I have had difficulty sleeping. 0    I have felt sad or miserable. 0    I have been so unhappy that I have been crying. 0    The thought of harming myself has occurred to me. 0    Edinburgh Postnatal Depression Scale Total 0             Health Maintenance Due  Topic Date Due   COVID-19 Vaccine (1) Never done   INFLUENZA VACCINE  Never done    The following portions of the patient's history were reviewed and updated as appropriate: allergies, current medications, past family history, past medical history, past social history, past surgical history, and problem list.  Review of Systems Constitutional: negative Eyes: negative Ears, nose,  mouth, throat, and face: negative Respiratory: negative Cardiovascular: negative Gastrointestinal: negative Genitourinary:negative Integument/breast: negative Hematologic/lymphatic: negative Musculoskeletal:negative Neurological: negative Behavioral/Psych: negative Endocrine: negative Allergic/Immunologic: negative  Objective:  BP (!) 114/54 (BP Location: Left Arm, Patient Position: Sitting, Cuff Size: Normal)   Pulse (!) 114   Temp 98.4 F (36.9 C) (Oral)   Ht 5\' 4"  (1.626 m)   Wt 192 lb 6.4 oz (87.3 kg)   LMP 05/19/2020 (Exact Date)   Breastfeeding Yes   BMI 33.03 kg/m    General:  alert, cooperative, and no distress   Breasts:  normal  Lungs: clear to auscultation bilaterally  Heart:  regular rate and rhythm, S1, S2 normal, no murmur, click, rub or gallop  Abdomen: soft, non-tender; bowel sounds normal; no masses,  no organomegaly   Wound N/A  GU exam:  not indicated       Assessment:   Encounter for postpartum care of lactating mother  - Normal postpartum exam.   Plan:   Essential components of care per ACOG recommendations:  1.  Mood and well being: Patient with negative depression screening today. Reviewed local resources for support.  - Patient tobacco use? No.   - hx of drug use? No.    2. Infant care and feeding:  -Patient currently breastmilk feeding? Yes. Discussed returning to work and pumping. Reviewed importance of  draining breast regularly to support lactation.  -Social determinants of health (SDOH) reviewed in EPIC. No concerns  3. Sexuality, contraception and birth spacing - Patient does not want a pregnancy in the next year.  Desired family size is unsure of number children.  - Reviewed forms of contraception in tiered fashion. Patient desired condoms today.   - Discussed birth spacing of 18 months  4. Sleep and fatigue -Encouraged family/partner/community support of 4 hrs of uninterrupted sleep to help with mood and fatigue  5. Physical  Recovery  - Discussed patients delivery and complications. She describes her labor as good. - Patient had a Vaginal, no problems at delivery. Patient had  no  laceration. Perineal healing reviewed. Patient expressed understanding - Patient has urinary incontinence? No. - Patient is safe to resume physical and sexual activity  6.  Health Maintenance - HM due items addressed Yes - Last pap smear  Diagnosis  Date Value Ref Range Status  01/31/2021   Final   - Negative for intraepithelial lesion or malignancy (NILM)   Pap smear not done at today's visit.  -Breast Cancer screening indicated? No.   7. Chronic Disease/Pregnancy Condition follow up: None  - PCP follow up  Laury Deep, [place] [facility] [place]

## 2021-03-25 ENCOUNTER — Encounter: Payer: Self-pay | Admitting: Obstetrics and Gynecology

## 2021-08-23 ENCOUNTER — Ambulatory Visit
Admission: EM | Admit: 2021-08-23 | Discharge: 2021-08-23 | Disposition: A | Discharge status: Home / Self Care | Payer: BC Managed Care – PPO | Attending: Physician Assistant | Admitting: Physician Assistant

## 2021-08-23 DIAGNOSIS — H6121 Impacted cerumen, right ear: Secondary | ICD-10-CM

## 2021-08-23 NOTE — ED Triage Notes (Signed)
Pt c/o right ear being "clogged up" without hearing loss first noticed yesterday. States this happens at it usually unclogs within a few hours but it's not doing that this time.  ?

## 2021-08-23 NOTE — ED Provider Notes (Signed)
?[facility] ? ? ? ?CSN: [id number] ?Arrival date & time: 08/23/21  1236 ? ? ?  ? ?History   ?Chief Complaint ?Chief Complaint  ?Patient presents with  ? right ear clogged up  ? ? ?HPI ?Sheri Lawrence is a 26 y.o. female.  ? ?Patient here today for evaluation of sensation of right ear fullness. She denies any issues with her left ear. She has not had any fever, congestion, sore throat or cough. She has not tried any treatment for symptoms.  ? ?The history is provided by the patient.  ? ?Past Medical History:  ?Diagnosis Date  ? Anemia   ? Anxiety   ? Depression   ? currently in therapy  ? Fibroid   ? UTI (urinary tract infection)   ? ? ?Patient Active Problem List  ? Diagnosis Date Noted  ? Fibroids 02/12/2021  ? Indication for care in labor and delivery, antepartum 02/12/2021  ? Back pain affecting pregnancy in third trimester 02/08/2021  ? Iron deficiency anemia 12/08/2020  ? Supervision of normal first pregnancy, antepartum 08/21/2020  ? ? ?Past Surgical History:  ?Procedure Laterality Date  ? TONSILLECTOMY    ? ? ?OB History   ? ? Gravida  ?1  ? Para  ?1  ? Term  ?1  ? Preterm  ?   ? AB  ?   ? Living  ?1  ?  ? ? SAB  ?   ? IAB  ?   ? Ectopic  ?   ? Multiple  ?0  ? Live Births  ?1  ?   ?  ?  ? ? ? ?Home Medications   ? ?Prior to Admission medications   ?Medication Sig Start Date End Date Taking? Authorizing Provider  ?acetaminophen (TYLENOL) 325 MG tablet Take 2 tablets (650 mg total) by mouth every 4 (four) hours as needed (for pain scale < 4). 02/14/21   Eppie Gibson, MD  ?cyclobenzaprine (FLEXERIL) 10 MG tablet Take 1 tablet (10 mg total) by mouth every 8 (eight) hours as needed for muscle spasms. 02/08/21   Gabriel Carina, CNM  ?ferrous sulfate 325 (65 FE) MG EC tablet Take 1 tablet (325 mg total) by mouth every other day. 12/13/20   Gavin Pound, CNM  ?ibuprofen (ADVIL) 200 MG tablet Take 3 tablets (600 mg total) by mouth every 6 (six) hours. 02/14/21   Eppie Gibson, MD  ?Magnesium  Oxide (MAG-OXIDE) 200 MG TABS Take 2 tablets (400 mg total) by mouth at bedtime. If that amount causes loose stools in the am, switch to '200mg'$  daily at bedtime. 02/08/21   Gabriel Carina, CNM  ?Prenatal Vit-Fe Fumarate-FA (PRENATAL PO) Take 2 tablets by mouth daily. Prenatal Gummies    [provider]  ? ? ?Family History ?Family History  ?Problem Relation Age of Onset  ? Healthy Mother   ? Depression Father   ? Anxiety disorder Father   ? Other Father 45  ?     "died in his sleep"  ? Alcohol abuse Maternal Grandfather   ? ? ?Social History ?Social History  ? ?Tobacco Use  ? Smoking status: Never  ? Smokeless tobacco: Never  ?Vaping Use  ? Vaping Use: Never used  ?Substance Use Topics  ? Alcohol use: Not Currently  ? Drug use: Never  ? ? ? ?Allergies   ?Patient has no known allergies. ? ? ?Review of Systems ?Review of Systems  ?Constitutional:  Negative for chills and fever.  ?  HENT:  Negative for congestion, ear pain and rhinorrhea.   ?Eyes:  Negative for discharge and redness.  ?Respiratory:  Negative for shortness of breath.   ?Gastrointestinal:  Negative for nausea and vomiting.  ? ? ?Physical Exam ?Triage Vital Signs ?ED Triage Vitals [08/23/21 1305]  ?Enc Vitals Group  ?   BP 125/83  ?   Pulse Rate 93  ?   Resp 18  ?   Temp 98.2 ?F (36.8 ?C)  ?   Temp Source Oral  ?   SpO2 97 %  ?   Weight   ?   Height   ?   Head Circumference   ?   Peak Flow   ?   Pain Score 0  ?   Pain Loc   ?   Pain Edu?   ?   Excl. in [place]?   ? ?No data found. ? ?Updated Vital Signs ?BP 125/83 (BP Location: Left Arm)   Pulse 93   Temp 98.2 ?F (36.8 ?C) (Oral)   Resp 18   SpO2 97%   Breastfeeding Yes  ? ?Physical Exam ?Vitals and nursing note reviewed.  ?Constitutional:   ?   General: She is not in acute distress. ?   Appearance: Normal appearance. She is not ill-appearing.  ?HENT:  ?   Head: Normocephalic and atraumatic.  ?   Right Ear: There is impacted cerumen.  ?   Left Ear: Tympanic membrane normal.  ?   Nose: Nose normal.  No congestion or rhinorrhea.  ?Eyes:  ?   Conjunctiva/sclera: Conjunctivae normal.  ?Cardiovascular:  ?   Rate and Rhythm: Normal rate.  ?Pulmonary:  ?   Effort: Pulmonary effort is normal.  ?Neurological:  ?   Mental Status: She is alert.  ? ? ? ?UC Treatments / Results  ?Labs ?(all labs ordered are listed, but only abnormal results are displayed) ?Labs Reviewed - No data to display ? ?EKG ? ? ?Radiology ?No results found. ? ?Procedures ?Procedures (including critical care time) ? ?Medications Ordered in UC ?Medications - No data to display ? ?Initial Impression / Assessment and Plan / UC Course  ?I have reviewed the triage vital signs and the nursing notes. ? ?Pertinent labs & imaging results that were available during my care of the patient were reviewed by me and considered in my medical decision making (see chart for details). ? ?  ?Ear irrigated in office with improvement of symptoms. Recommended continued monitoring and follow up with any further concerns.  ? ?Final Clinical Impressions(s) / UC Diagnoses  ? ?Final diagnoses:  ?Impacted cerumen of right ear  ? ?Discharge Instructions   ?None ?  ? ?ED Prescriptions   ?None ?  ? ?PDMP not reviewed this encounter. ?  ?Francene Finders, PA-C ?08/23/21 1402 ? ?

## 2021-11-28 ENCOUNTER — Other Ambulatory Visit: Payer: Self-pay

## 2021-11-28 ENCOUNTER — Emergency Department (HOSPITAL_COMMUNITY)
Admission: EM | Admit: 2021-11-28 | Discharge: 2021-11-29 | Disposition: A | Discharge status: Home / Self Care | Payer: BC Managed Care – PPO | Attending: Emergency Medicine | Admitting: Emergency Medicine

## 2021-11-28 DIAGNOSIS — H7402 Tympanosclerosis, left ear: Secondary | ICD-10-CM | POA: Insufficient documentation

## 2021-11-28 DIAGNOSIS — H60592 Other noninfective acute otitis externa, left ear: Secondary | ICD-10-CM

## 2021-11-28 DIAGNOSIS — H9202 Otalgia, left ear: Secondary | ICD-10-CM | POA: Diagnosis present

## 2021-11-28 MED ORDER — CIPROFLOXACIN-DEXAMETHASONE 0.3-0.1 % OT SUSP
4.0000 [drp] | Freq: Once | OTIC | Status: AC
  Administered 2021-11-28: 4 [drp] via OTIC
  Filled 2021-11-28: qty 7.5

## 2021-11-28 NOTE — Discharge Instructions (Signed)
Use the Ciprodex drops 4 times daily until cleared.  This to be for probably 3 to 5 days.  He started having worsening symptoms such as fever, difficulty hearing, spreading pain return back to ED for further evaluation.  Follow-up with your primary next week for recheck to make sure things are improving.

## 2021-11-28 NOTE — ED Provider Notes (Signed)
[facility] DEPT Provider Note   CSN: [id number] Arrival date & time: 11/28/21  2319     History  Chief Complaint  Patient presents with   Otalgia    Sheri Lawrence is a 26 y.o. female.   Otalgia   Patient presents due to left ear pain.  Started acutely today, is been constant.  Is worse with any movement of the ear, she has not had any specific drainage yet.  Denies any fevers, no dental pain.  No recent air travel, no history of sinus congestions.  Home Medications Prior to Admission medications   Medication Sig Start Date End Date Taking? Authorizing Provider  acetaminophen (TYLENOL) 325 MG tablet Take 2 tablets (650 mg total) by mouth every 4 (four) hours as needed (for pain scale < 4). 02/14/21   Eppie Gibson, MD  cyclobenzaprine (FLEXERIL) 10 MG tablet Take 1 tablet (10 mg total) by mouth every 8 (eight) hours as needed for muscle spasms. 02/08/21   Gabriel Carina, CNM  ferrous sulfate 325 (65 FE) MG EC tablet Take 1 tablet (325 mg total) by mouth every other day. 12/13/20   Gavin Pound, CNM  ibuprofen (ADVIL) 200 MG tablet Take 3 tablets (600 mg total) by mouth every 6 (six) hours. 02/14/21   Eppie Gibson, MD  Magnesium Oxide (MAG-OXIDE) 200 MG TABS Take 2 tablets (400 mg total) by mouth at bedtime. If that amount causes loose stools in the am, switch to '200mg'$  daily at bedtime. 02/08/21   Gabriel Carina, CNM  Prenatal Vit-Fe Fumarate-FA (PRENATAL PO) Take 2 tablets by mouth daily. Prenatal Gummies    [provider]      Allergies    Patient has no known allergies.    Review of Systems   Review of Systems  HENT:  Positive for ear pain.     Physical Exam Updated Vital Signs BP 137/78   Pulse 89   Temp 98.9 F (37.2 C)   Resp 16   Ht '5\' 4"'$  (1.626 m)   Wt 93 kg   SpO2 99%   BMI 35.19 kg/m  Physical Exam Vitals and nursing note reviewed. Exam conducted with a chaperone present.  Constitutional:       General: She is not in acute distress.    Appearance: Normal appearance.  HENT:     Head: Normocephalic and atraumatic.     Right Ear: Tympanic membrane normal.     Left Ear: Tympanic membrane normal.     Ears:     Comments: Tragal tenderness, left ear canal with debris but no drainage.  TM has some mild sclerosis but no bulging or erythema Eyes:     General: No scleral icterus.    Extraocular Movements: Extraocular movements intact.     Pupils: Pupils are equal, round, and reactive to light.  Skin:    Coloration: Skin is not jaundiced.  Neurological:     Mental Status: She is alert. Mental status is at baseline.     Coordination: Coordination normal.     ED Results / Procedures / Treatments   Labs (all labs ordered are listed, but only abnormal results are displayed) Labs Reviewed - No data to display  EKG None  Radiology No results found.  Procedures Procedures    Medications Ordered in ED Medications  ciprofloxacin-dexamethasone (CIPRODEX) 0.3-0.1 % OTIC (EAR) suspension 4 drop (has no administration in time range)    ED Course/ Medical Decision Making/ A&P  Medical Decision Making  Patient presents due to left ear pain.  She has tragal tenderness on exam, there is also some debris in the external canal.  Consistent with external otitis, I do not think this is necrotizing.  No signs of mastoiditis.  TM is clear although there is some sclerosis, patient does states she has a history of frequent ear infections as a child.    Plan will be to give Ciprodex and have her follow-up with primary if the pain is persistent.  Return precautions were discussed, Ciprodex given here in the ED.  Patient discharged in stable condition.        Final Clinical Impression(s) / ED Diagnoses Final diagnoses:  None    Rx / DC Orders ED Discharge Orders     None         Sherrill Raring, PA-C 11/28/21 2342    Malvin Johns, MD 11/29/21  1504

## 2021-11-28 NOTE — ED Triage Notes (Signed)
Patient c/o left ear pain. Pt report pain started today at work and getting worst tonight. Pt denies dizziness, lightheadedness. Pt denies N/V and fever.

## 2022-05-04 IMAGING — US US OB COMP LESS 14 WK
1 series · 13 of 28 positions shown · non-contrast
Comparison: None.

CLINICAL DATA: Vaginal bleeding. Last menstrual period 05/19/2020.
Gestational age by last menstrual period of 6 weeks and 1 day.
Estimated due date by last menstrual period 02/23/2021. Quantitative
beta HCG of 20,027

EXAM:
OBSTETRIC <14 WK US AND TRANSVAGINAL OB US
TECHNIQUE: Both transabdominal and transvaginal ultrasound examinations were
performed for complete evaluation of the gestation as well as the
maternal uterus, adnexal regions, and pelvic cul-de-sac.
Transvaginal technique was performed to assess early pregnancy.

[Series 1: us ob comp less 14 wk · 43 acquisitions, 13 frames shown]
[im 2/43]
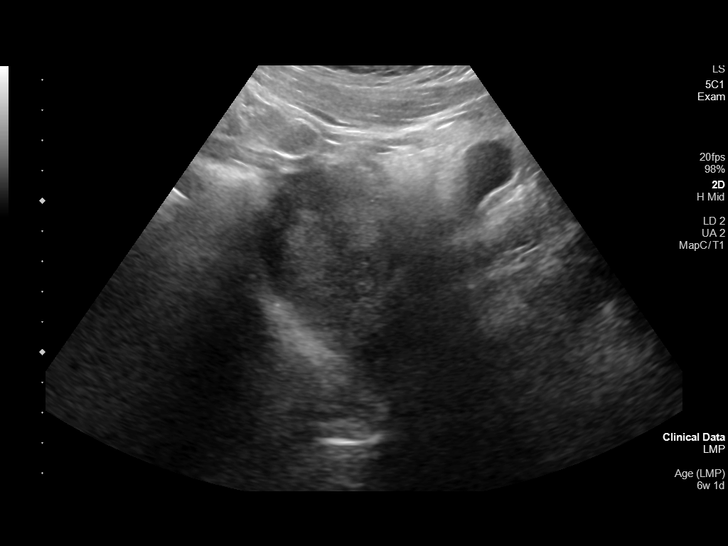
[im 5/43]
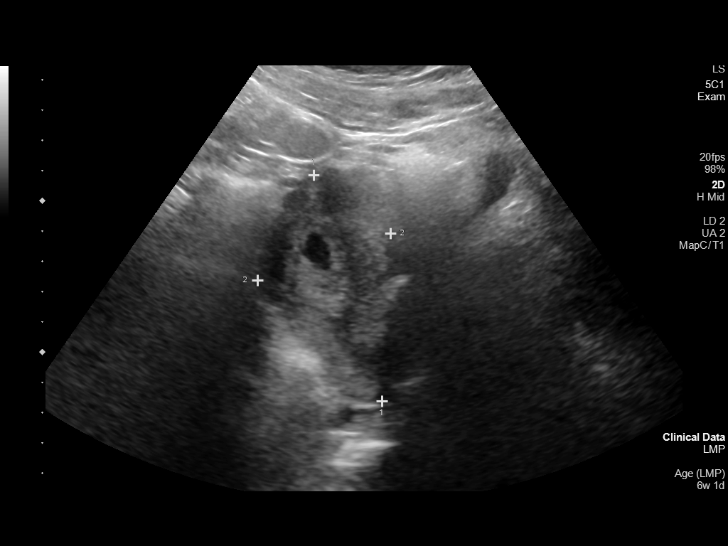
[im 8/43]
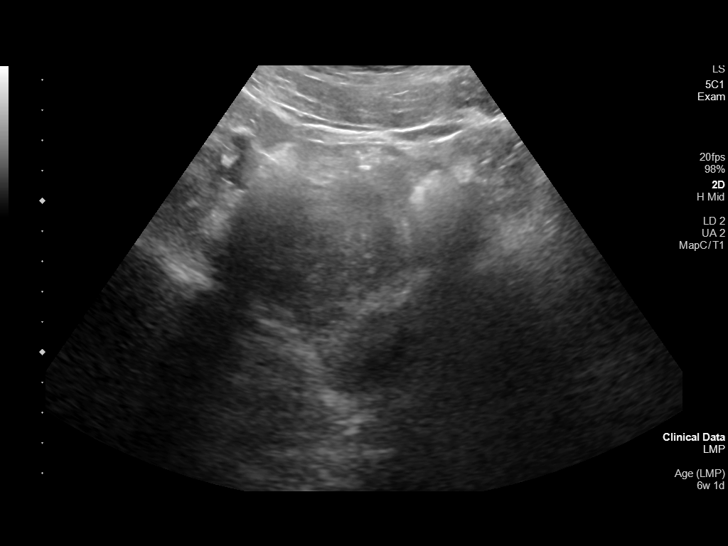
[im 11/43]
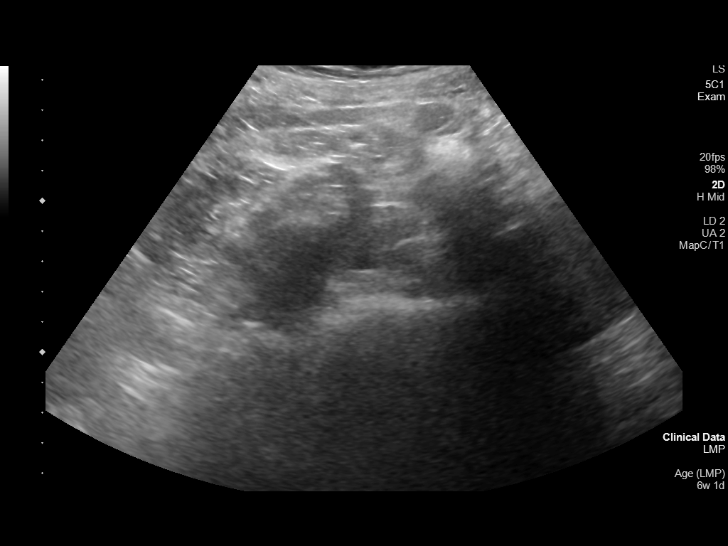
[im 15/43]
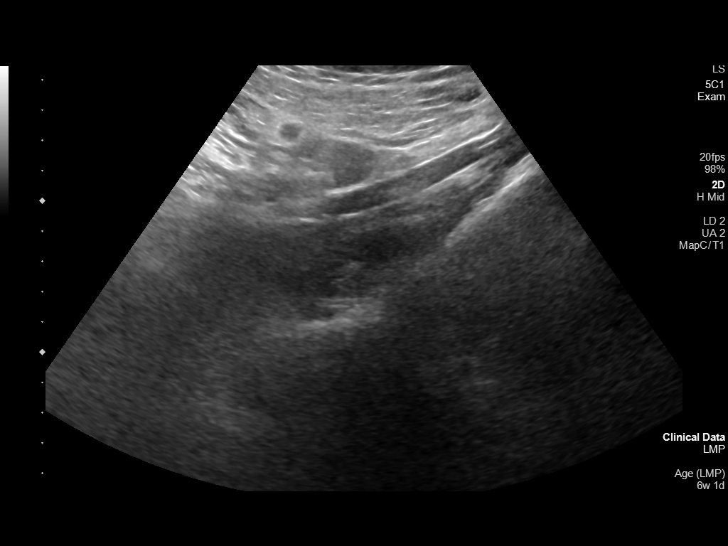
[im 18/43]
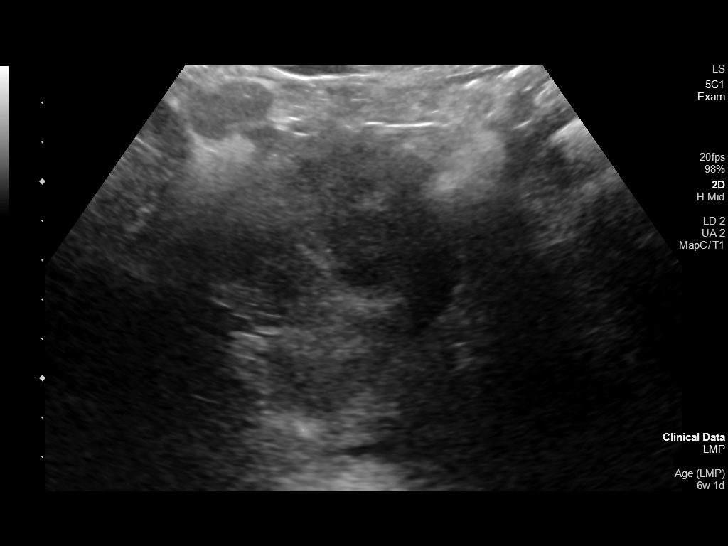
[im 22/43]
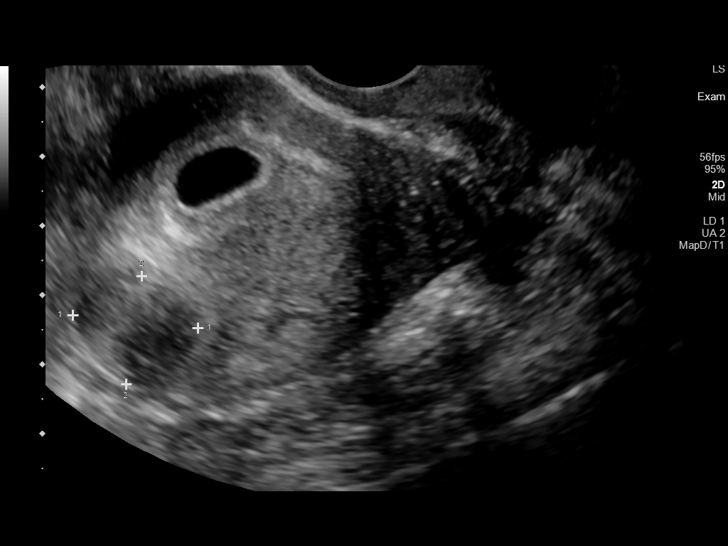
[im 25/43]
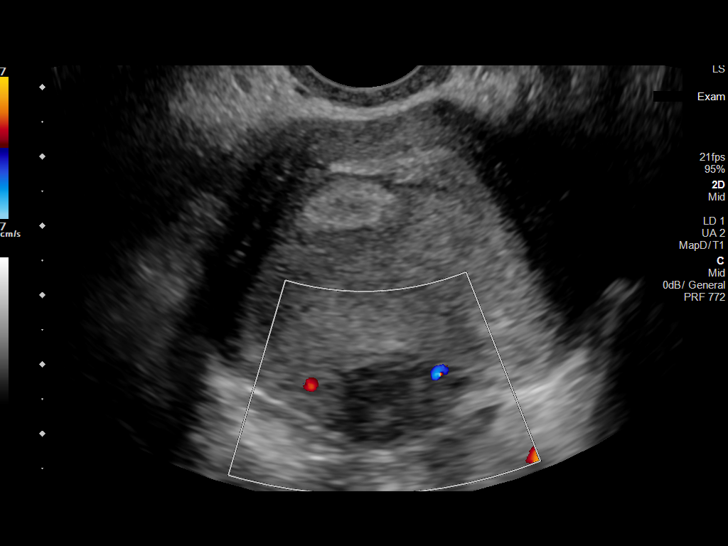
[im 29/43]
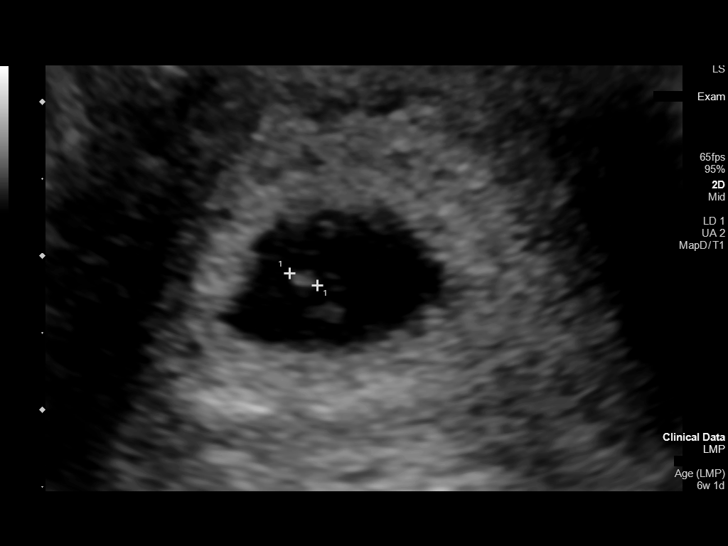
[im 32/43]
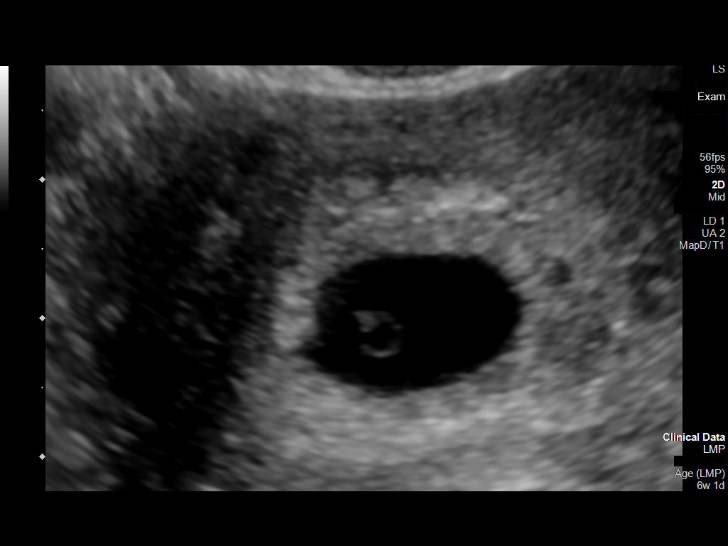
[im 35/43]
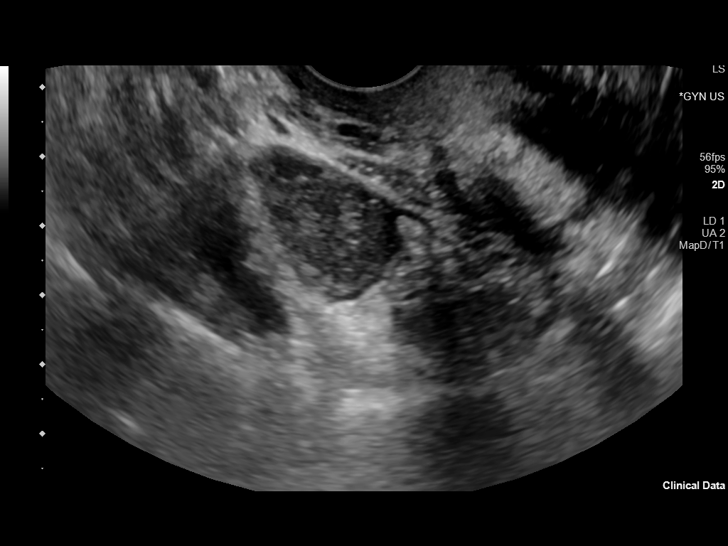
[im 38/43]
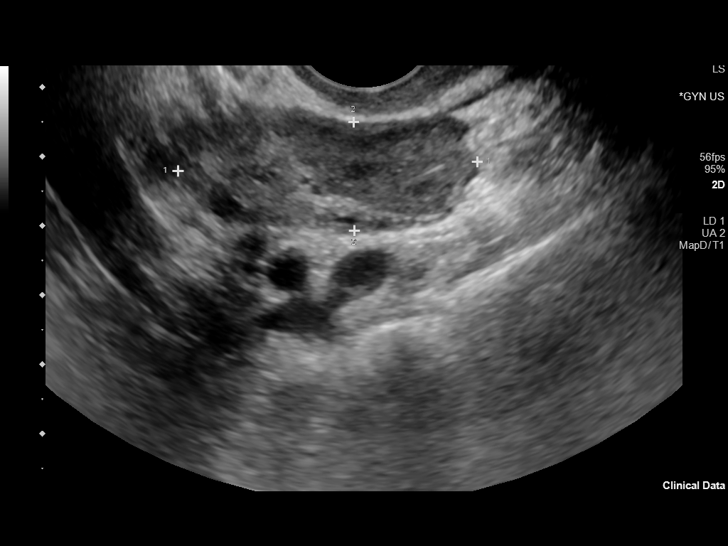
[im 41/43]
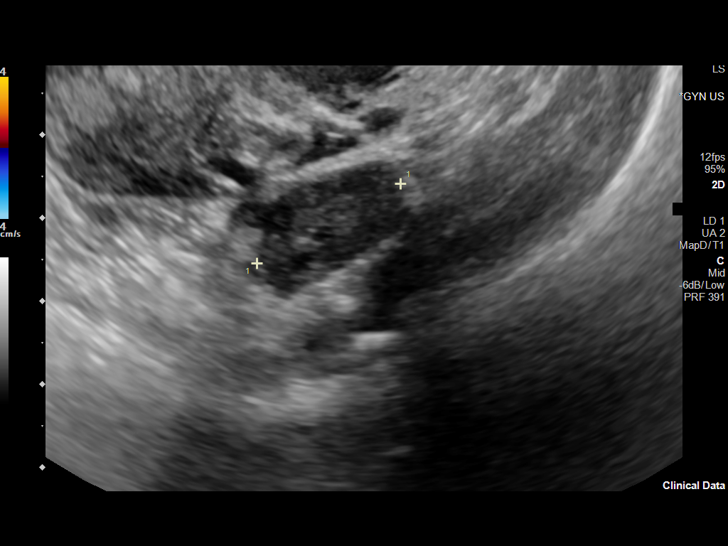

[13 of 28 positions shown; findings below may reference images not displayed]

FINDINGS: Intrauterine gestational sac: Single

Yolk sac:  Visualized.

Embryo:  Visualized.

Cardiac Activity: Visualized.

Heart Rate: Per ultrasound technologist, cardiac activity is
visualized but difficult to measure.

CRL:  2 mm   5 w   5 d                  US EDC: 02/26/2021

Subchorionic hemorrhage:  None visualized.

Maternal uterus/adnexae:

There is a posterior fundal intramural hypodense lesion measuring
1.8 x 1.6 x 1.4 cm consistent with a uterine fibroid. The remainder
of the uterus is unremarkable.

The right ovary is unremarkable and measures 2.3 x 4.3 x 1.6 cm. The
left ovary is unremarkable and measures 2 x 3 x 1.2 cm. Bilateral
adnexal regions are otherwise unremarkable.

Other: No free intraperitoneal fluid within the pelvis.
IMPRESSION: 1. Single intrauterine pregnancy with difficulty to obtain fetal
heart rate even though cardiac activity is visualized. This may be
due to an early intrauterine pregnancy timeline. Recommend repeat
ultrasound in 7 days for further evaluation of viability.
2. Gestational age by ultrasound of 5 weeks and 5 days is concordant
with gestational age by last menstrual period of 6 weeks and 1 day.
3. Posterior fundal intramural uterine fibroid measuring up to
cm.

## 2022-09-15 IMAGING — US US MFM OB FOLLOW-UP
1 series · 14 of 28 positions shown · non-contrast
Comparison: none

[Series 1: us mfm ob follow-up · 63 acquisitions, 14 frames shown]
[im 3/63]
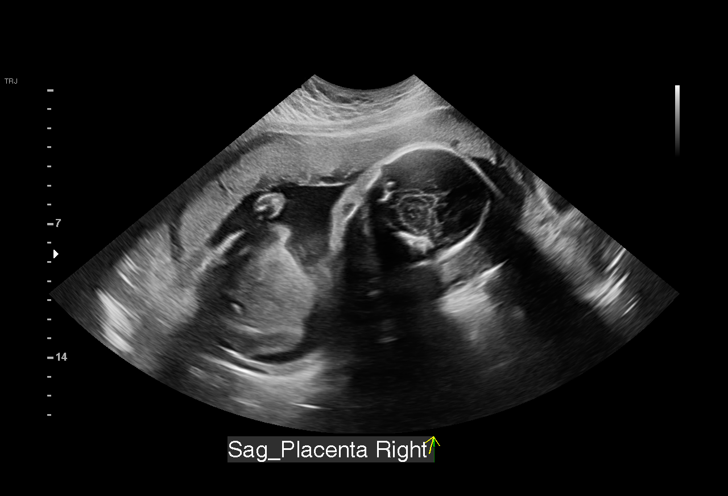
[im 7/63]
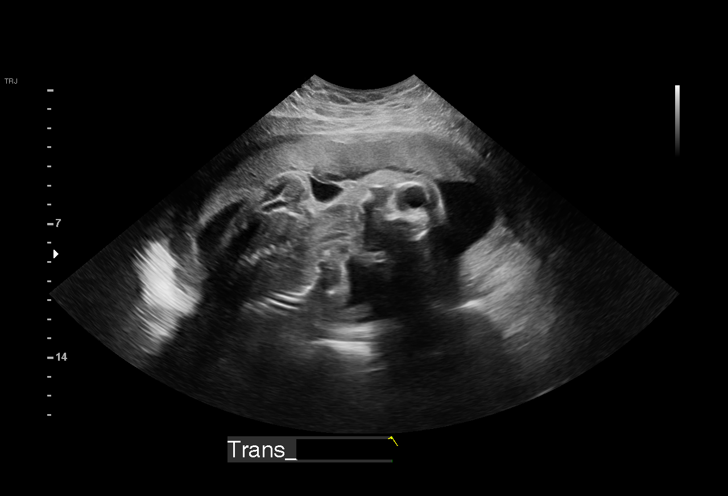
[im 12/63]
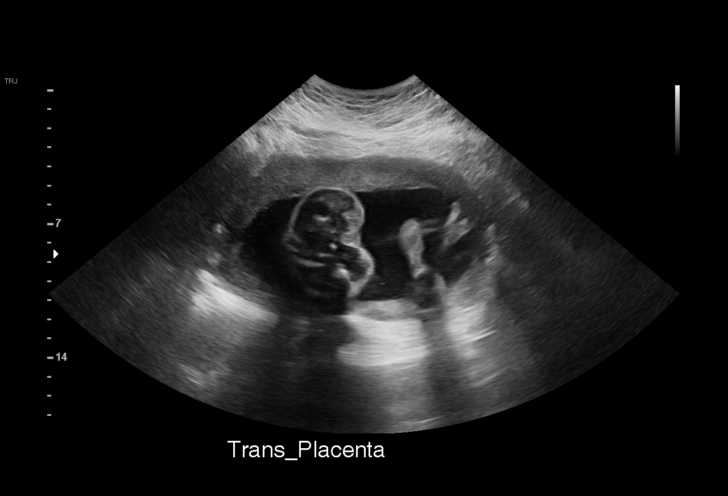
[im 17/63]
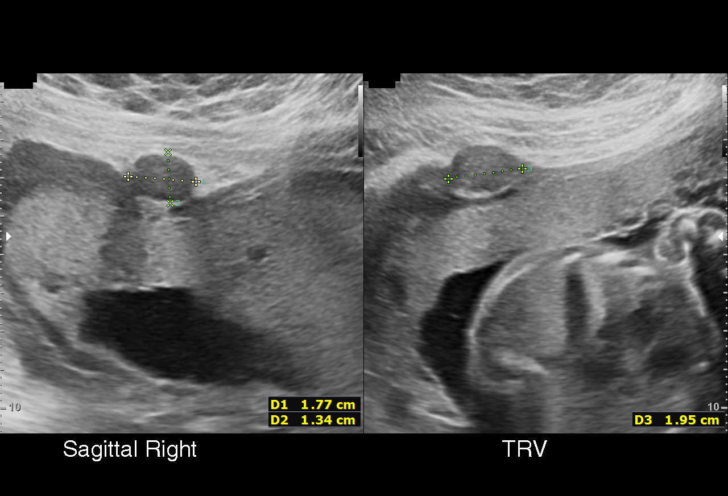
[im 21/63]
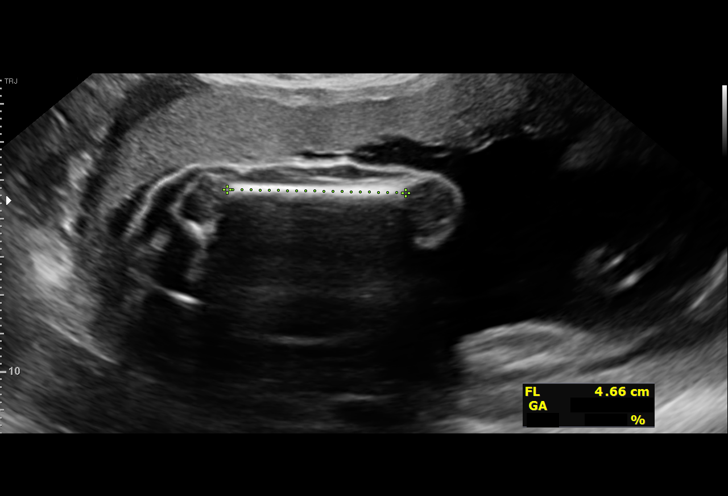
[im 26/63]
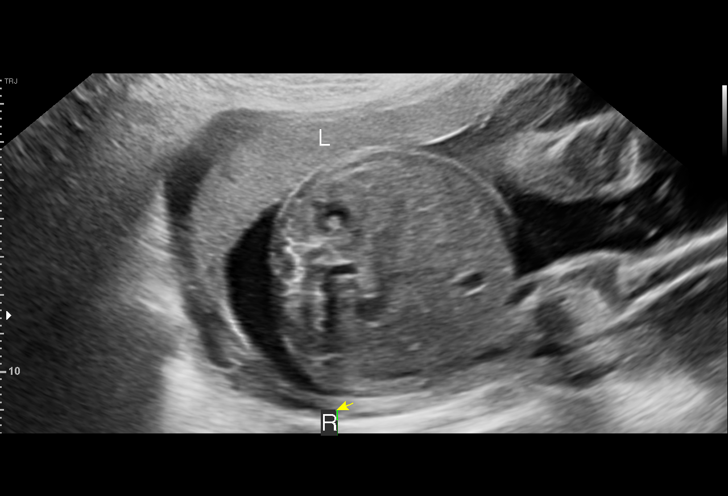
[im 30/63]
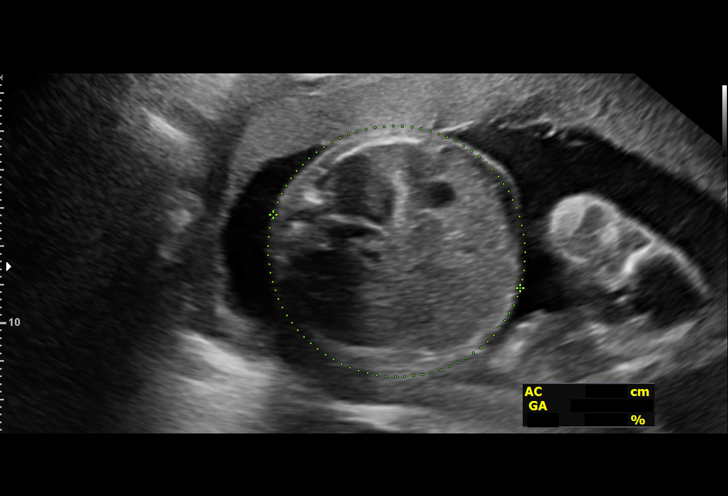
[im 35/63]
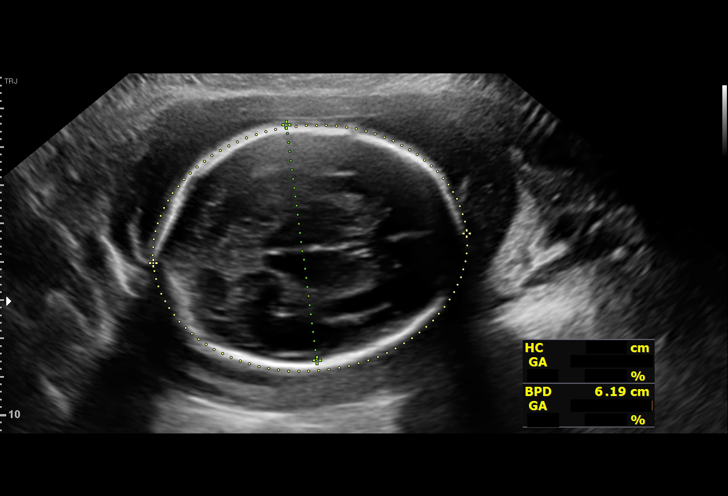
[im 40/63]
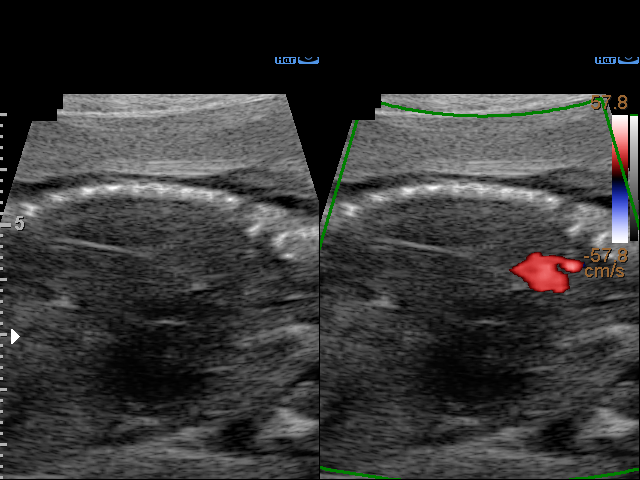
[im 44/63]
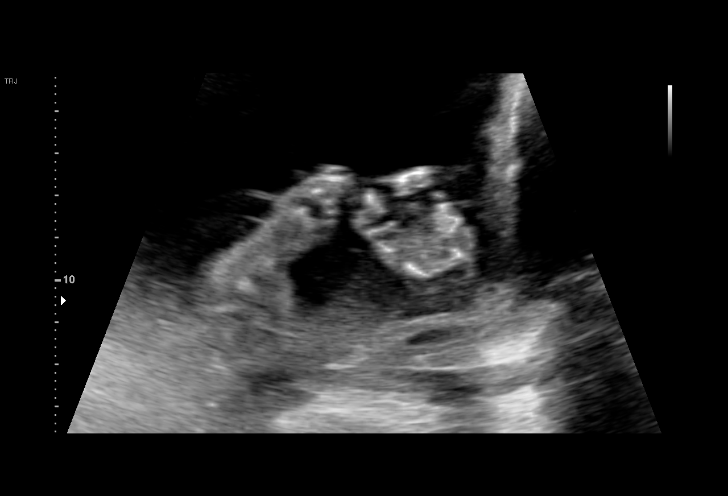
[im 49/63]
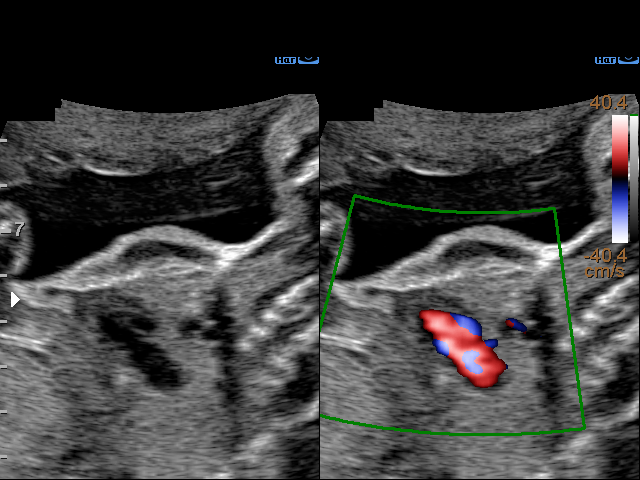
[im 53/63]
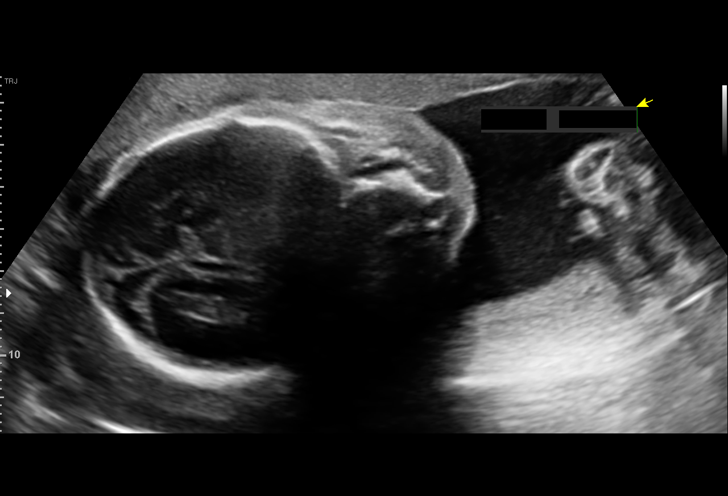
[im 58/63]
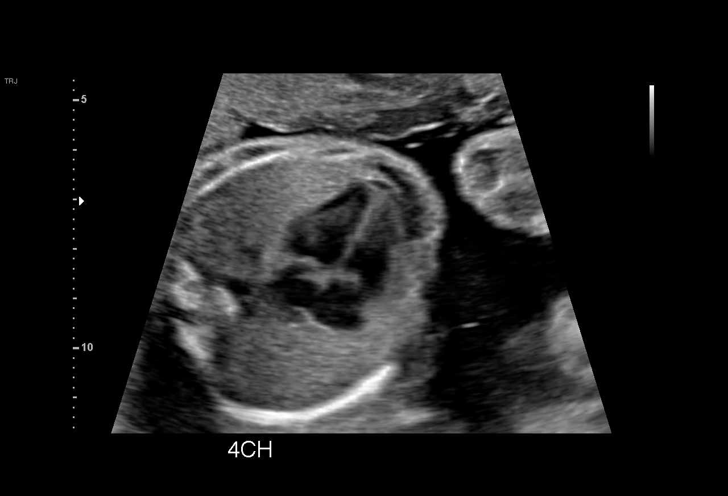
[im 63/63]
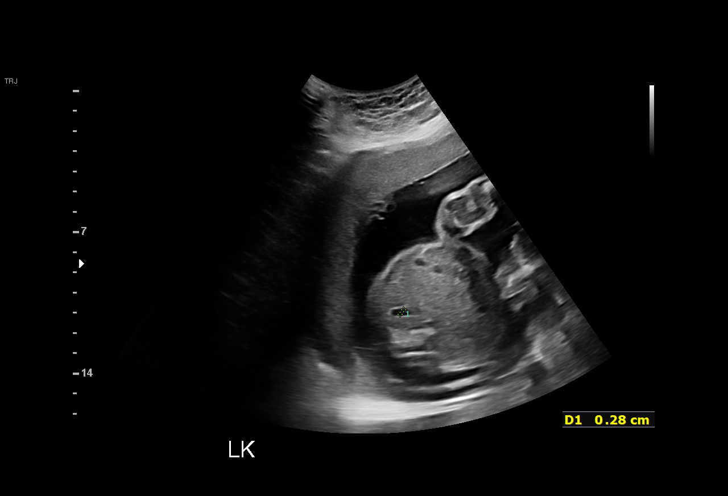

[14 of 28 positions shown; findings below may reference images not displayed]

[place]

Indications

 25 weeks gestation of pregnancy
 Encounter for fetal growth retardation
 Uterine fibroids
 Pyelectasis of fetus on prenatal ultrasound
Fetal Evaluation

 Num Of Fetuses:         1
 Fetal Heart Rate(bpm):  148
 Cardiac Activity:       Observed
 Presentation:           Cephalic
 Placenta:               Anterior

 Amniotic Fluid
 AFI FV:      Within normal limits

                             Largest Pocket(cm)

Biometry

 BPD:      61.4  mm     G. Age:  25w 0d         30  %    CI:        71.56   %    70 - 86
                                                         FL/HC:      20.3   %    18.7 -
 HC:      231.1  mm     G. Age:  25w 1d         23  %    HC/AC:      1.10        1.04 -
 AC:      210.9  mm     G. Age:  25w 4d         52  %    FL/BPD:     76.2   %    71 - 87
 FL:       46.8  mm     G. Age:  25w 4d         46  %    FL/AC:      22.2   %    20 - 24

 Est. FW:     820  gm    1 lb 13 oz      50  %
OB History
 Gravidity:    1
Gestational Age

 LMP:           25w 2d        Date:  05/19/20                 EDD:   02/23/21
 U/S Today:     25w 2d                                        EDD:   02/23/21
 Best:          25w 2d     Det. By:  LMP  (05/19/20)          EDD:   02/23/21
Anatomy

 Cranium:               Appears normal         Aortic Arch:            Appears normal
 Cavum:                 Appears normal         Ductal Arch:            Previously seen
 Ventricles:            Appears normal         Diaphragm:              Appears normal
 Choroid Plexus:        Previously seen        Stomach:                Appears normal, left
                                                                       sided
 Cerebellum:            Appears normal         Abdomen:                Previously seen
 Posterior Fossa:       Appears normal         Abdominal Wall:         Previously seen
 Nuchal Fold:           Not applicable (>20    Cord Vessels:           Previously seen
                        wks GA)
 Face:                  Orbits and profile     Kidneys:                Left UTD  5  mm
                        previously seen
 Lips:                  Previously seen        Bladder:                Appears normal
 Palate:                Appears normal         Spine:                  Previously seen
 Heart:                 Appears normal; EIF    Upper Extremities:      Previously seen
 RVOT:                  Previously seen        Lower Extremities:      Previously seen
 LVOT:                  Appears normal
Myomas

 Site                     L(cm)      W(cm)      D(cm)       Location
 Left Fundal
 Right anterior superior  1.8        1.3        2

 Blood Flow                  RI       PI       Comments

Impression

 Patient returned for completion of fetal anatomy (aortic arch).
 An echogenic intracardiac focus was seen on previous
 ultrasound.  Patient had opted not to screen for fetal
 aneuploidies.
 Fetal growth is appropriate for gestational age.  Amniotic fluid
 is normal and good fetal activity seen.  Left urinary tract
 dilation measuring 5 mm is seen.  The cortex appears
 normal.  Right pelvis appears normal.
 Two small intramural myomas are seen (measurements
 above).
 I counseled the patient on the findings that most UTD is
 resolved with advancing gestation or after birth and only
 rarely associated with obstructive uropathy.
 I informed her that UTD is a soft marker for Down syndrome.
 I discussed the option of cell-free fetal DNA screening for
 Down syndrome or amniocentesis for definitive result on the
 fetal karyotype.
 Patient opted not to screen for fetal aneuploidies or have
 amniocentesis.
Recommendations

 -An appointment was made for her to return in 8 weeks for
 fetal growth and renal assessments.
                 Parra, Gbenga

## 2023-01-23 ENCOUNTER — Ambulatory Visit
Admission: EM | Admit: 2023-01-23 | Discharge: 2023-01-23 | Disposition: A | Discharge status: Home / Self Care | Payer: BC Managed Care – PPO | Source: Home / Self Care

## 2023-01-23 DIAGNOSIS — K0889 Other specified disorders of teeth and supporting structures: Secondary | ICD-10-CM | POA: Diagnosis not present

## 2023-01-23 MED ORDER — AMOXICILLIN 500 MG PO CAPS: 500.0000 mg | ORAL_CAPSULE | Freq: Three times a day (TID) | ORAL | 0 refills | Status: DC

## 2023-01-23 NOTE — ED Provider Notes (Signed)
[facility]    CSN: [id number] Arrival date & time: 01/23/23  1246      History   Chief Complaint No chief complaint on file.   HPI Sheri Lawrence is a 27 y.o. female.   The history is provided by the patient.    Past Medical History:  Diagnosis Date  . Anemia   . Anxiety   . Depression    currently in therapy  . Fibroid   . UTI (urinary tract infection)     Patient Active Problem List   Diagnosis Date Noted  . Fibroids 02/12/2021  . Indication for care in labor and delivery, antepartum 02/12/2021  . Back pain affecting pregnancy in third trimester 02/08/2021  . Iron deficiency anemia 12/08/2020  . Supervision of normal first pregnancy, antepartum 08/21/2020    Past Surgical History:  Procedure Laterality Date  . TONSILLECTOMY      OB History     Gravida  1   Para  1   Term  1   Preterm      AB      Living  1      SAB      IAB      Ectopic      Multiple  0   Live Births  1            Home Medications    Prior to Admission medications   Medication Sig Start Date End Date Taking? Authorizing Provider  acetaminophen (TYLENOL) 325 MG tablet Take 2 tablets (650 mg total) by mouth every 4 (four) hours as needed (for pain scale < 4). 02/14/21   Alicia Amel, MD  cyclobenzaprine (FLEXERIL) 10 MG tablet Take 1 tablet (10 mg total) by mouth every 8 (eight) hours as needed for muscle spasms. 02/08/21   Bernerd Limbo, CNM  ferrous sulfate 325 (65 FE) MG EC tablet Take 1 tablet (325 mg total) by mouth every other day. 12/13/20   Gerrit Heck, CNM  ibuprofen (ADVIL) 200 MG tablet Take 3 tablets (600 mg total) by mouth every 6 (six) hours. 02/14/21   Alicia Amel, MD  Magnesium Oxide (MAG-OXIDE) 200 MG TABS Take 2 tablets (400 mg total) by mouth at bedtime. If that amount causes loose stools in the am, switch to 200mg  daily at bedtime. 02/08/21   Bernerd Limbo, CNM  Prenatal Vit-Fe Fumarate-FA (PRENATAL PO) Take 2  tablets by mouth daily. Prenatal Gummies    [provider]    Family History Family History  Problem Relation Age of Onset  . Healthy Mother   . Depression Father   . Anxiety disorder Father   . Other Father 23       "died in his sleep"  . Alcohol abuse Maternal Grandfather     Social History Social History   Tobacco Use  . Smoking status: Never  . Smokeless tobacco: Never  Vaping Use  . Vaping status: Never Used  Substance Use Topics  . Alcohol use: Not Currently  . Drug use: Never     Allergies   Patient has no known allergies.   Review of Systems Review of Systems  Constitutional:  Negative for chills and fever.  HENT:  Positive for dental problem.   Eyes:  Negative for discharge and redness.  Respiratory:  Negative for shortness of breath.   Gastrointestinal:  Negative for abdominal pain, nausea and vomiting.     Physical Exam Triage Vital Signs ED Triage Vitals  Encounter Vitals Group     BP      Systolic BP Percentile      Diastolic BP Percentile      Pulse      Resp      Temp      Temp src      SpO2      Weight      Height      Head Circumference      Peak Flow      Pain Score      Pain Loc      Pain Education      Exclude from Growth Chart    No data found.  Updated Vital Signs There were no vitals taken for this visit.   Physical Exam Vitals and nursing note reviewed.  Constitutional:      General: She is not in acute distress.    Appearance: Normal appearance. She is not ill-appearing.  HENT:     Head: Normocephalic and atraumatic.  Eyes:     Conjunctiva/sclera: Conjunctivae normal.  Cardiovascular:     Rate and Rhythm: Normal rate.  Pulmonary:     Effort: Pulmonary effort is normal.  Neurological:     Mental Status: She is alert.  Psychiatric:        Mood and Affect: Mood normal.        Behavior: Behavior normal.        Thought Content: Thought content normal.     UC Treatments / Results  Labs (all labs  ordered are listed, but only abnormal results are displayed) Labs Reviewed - No data to display  EKG   Radiology No results found.  Procedures Procedures (including critical care time)  Medications Ordered in UC Medications - No data to display  Initial Impression / Assessment and Plan / UC Course  I have reviewed the triage vital signs and the nursing notes.  Pertinent labs & imaging results that were available during my care of the patient were reviewed by me and considered in my medical decision making (see chart for details).     *** Final Clinical Impressions(s) / UC Diagnoses   Final diagnoses:  None   Discharge Instructions   None    ED Prescriptions   None    PDMP not reviewed this encounter.

## 2023-01-23 NOTE — ED Triage Notes (Signed)
Patient presents to [facility] for right lower dental pain since weds. Concerned with maybe issues with her wisdom tooth. Treating pain with ibuprofen.

## 2023-01-24 ENCOUNTER — Encounter: Payer: Self-pay | Admitting: Physician Assistant

## 2023-02-03 ENCOUNTER — Other Ambulatory Visit: Payer: Self-pay

## 2023-02-03 ENCOUNTER — Encounter (HOSPITAL_BASED_OUTPATIENT_CLINIC_OR_DEPARTMENT_OTHER): Payer: Self-pay

## 2023-02-03 ENCOUNTER — Emergency Department (HOSPITAL_BASED_OUTPATIENT_CLINIC_OR_DEPARTMENT_OTHER)
Admission: EM | Admit: 2023-02-03 | Discharge: 2023-02-04 | Disposition: A | Discharge status: Home / Self Care | Payer: BC Managed Care – PPO | Attending: Emergency Medicine | Admitting: Emergency Medicine

## 2023-02-03 DIAGNOSIS — L292 Pruritus vulvae: Secondary | ICD-10-CM | POA: Diagnosis present

## 2023-02-03 DIAGNOSIS — A609 Anogenital herpesviral infection, unspecified: Secondary | ICD-10-CM | POA: Diagnosis not present

## 2023-02-03 NOTE — ED Triage Notes (Signed)
Pt reports vaginal rash and possible abscess to vaginal area.  Started 1 week ago

## 2023-02-03 NOTE — ED Provider Notes (Signed)
[place] [facility]  Provider Note  CSN: [id number] Arrival date & time: 02/03/23 2316  History Chief Complaint  Patient presents with   Rash   Abscess    Sheri Lawrence is a 27 y.o. female reports about a week of vaginal itching, external rash and possible abscess. She has not had any vaginal discharge. Does not think she is pregnant and no concern for STI. She was on an antibiotic about 2 weeks ago for dental infection.    Home Medications Prior to Admission medications   Medication Sig Start Date End Date Taking? Authorizing Provider  valACYclovir (VALTREX) 1000 MG tablet Take 1 tablet (1,000 mg total) by mouth 3 (three) times daily. 02/04/23  Yes Pollyann Savoy, MD  acetaminophen (TYLENOL) 325 MG tablet Take 2 tablets (650 mg total) by mouth every 4 (four) hours as needed (for pain scale < 4). 02/14/21   Alicia Amel, MD  cyclobenzaprine (FLEXERIL) 10 MG tablet Take 1 tablet (10 mg total) by mouth every 8 (eight) hours as needed for muscle spasms. 02/08/21   Bernerd Limbo, CNM  ferrous sulfate 325 (65 FE) MG EC tablet Take 1 tablet (325 mg total) by mouth every other day. 12/13/20   Gerrit Heck, CNM  ibuprofen (ADVIL) 200 MG tablet Take 3 tablets (600 mg total) by mouth every 6 (six) hours. 02/14/21   Alicia Amel, MD  Magnesium Oxide (MAG-OXIDE) 200 MG TABS Take 2 tablets (400 mg total) by mouth at bedtime. If that amount causes loose stools in the am, switch to 200mg  daily at bedtime. 02/08/21   Bernerd Limbo, CNM  Prenatal Vit-Fe Fumarate-FA (PRENATAL PO) Take 2 tablets by mouth daily. Prenatal Gummies    [provider]     Allergies    Patient has no known allergies.   Review of Systems   Review of Systems Please see HPI for pertinent positives and negatives  Physical Exam BP 102/78 (BP Location: Left Arm)   Pulse (!) 107   Temp 97.8 F (36.6 C)   Resp 18   Ht 5\' 3"  (1.6 m)   Wt 104.3 kg   LMP  01/19/2023 (Approximate)   SpO2 100%   BMI 40.74 kg/m   Physical Exam Vitals and nursing note reviewed. Exam conducted with a chaperone present.  HENT:     Head: Normocephalic.     Nose: Nose normal.  Eyes:     Extraocular Movements: Extraocular movements intact.  Pulmonary:     Effort: Pulmonary effort is normal.  Genitourinary:    Comments: There is ulcerative lesions on L lower labia majora and perineum, no vaginal discharge. Cervix appears normal Musculoskeletal:        General: Normal range of motion.     Cervical back: Neck supple.  Skin:    Findings: No rash (on exposed skin).  Neurological:     Mental Status: She is alert and oriented to person, place, and time.  Psychiatric:        Mood and Affect: Mood normal.     ED Results / Procedures / Treatments   EKG None  Procedures Procedures  Medications Ordered in the ED Medications - No data to display  Initial Impression and Plan  Patient here with vaginal rash/pain, appears to be likely HSV by exam. Swabs including HSV sent.   ED Course   Clinical Course as of 02/04/23 0016  Wed Feb 04, 2023  0013 UA is clear, HCG  is neg. Wet prep neg for BV, trich or yeast. Will treat for HSV. Patient will follow up with PCP/Gyn.  [CS]    Clinical Course User Index [CS] Pollyann Savoy, MD     MDM Rules/Calculators/A&P Medical Decision Making Problems Addressed: HSV (herpes simplex virus) anogenital infection: acute illness or injury  Amount and/or Complexity of Data Reviewed Labs: ordered. Decision-making details documented in ED Course.  Risk Prescription drug management.     Final Clinical Impression(s) / ED Diagnoses Final diagnoses:  HSV (herpes simplex virus) anogenital infection    Rx / DC Orders ED Discharge Orders          Ordered    valACYclovir (VALTREX) 1000 MG tablet  3 times daily        02/04/23 0016             Pollyann Savoy, MD 02/04/23 [phone]

## 2023-02-04 LAB — URINALYSIS, MICROSCOPIC (REFLEX)

## 2023-02-04 LAB — URINALYSIS, ROUTINE W REFLEX MICROSCOPIC
Bilirubin Urine: NEGATIVE
Glucose, UA: NEGATIVE mg/dL
Ketones, ur: NEGATIVE mg/dL
Leukocytes,Ua: NEGATIVE
Nitrite: NEGATIVE
Protein, ur: 100 mg/dL — AB
Specific Gravity, Urine: >=1.03 (ref 1.005–1.030)
pH: 7 (ref 5.0–8.0)

## 2023-02-04 LAB — WET PREP, GENITAL
Clue Cells Wet Prep HPF POC: NONE SEEN
Sperm: NONE SEEN
Trich, Wet Prep: NONE SEEN
WBC, Wet Prep HPF POC: >=10 — AB (ref <10)
Yeast Wet Prep HPF POC: NONE SEEN

## 2023-02-04 LAB — PREGNANCY, URINE: Preg Test, Ur: NEGATIVE

## 2023-02-04 MED ORDER — VALACYCLOVIR HCL 1 G PO TABS: 1000.0000 mg | ORAL_TABLET | Freq: Three times a day (TID) | ORAL | 0 refills | Status: DC

## 2023-02-05 LAB — GC/CHLAMYDIA PROBE AMP (~~LOC~~) NOT AT ARMC
Chlamydia: NEGATIVE
Comment: NEGATIVE
Comment: NORMAL
Neisseria Gonorrhea: NEGATIVE

## 2023-02-06 LAB — HSV CULTURE AND TYPING

## 2023-04-10 ENCOUNTER — Encounter (HOSPITAL_BASED_OUTPATIENT_CLINIC_OR_DEPARTMENT_OTHER): Payer: Self-pay | Admitting: Emergency Medicine

## 2023-04-10 ENCOUNTER — Other Ambulatory Visit: Payer: Self-pay

## 2023-04-10 DIAGNOSIS — G4489 Other headache syndrome: Secondary | ICD-10-CM | POA: Diagnosis not present

## 2023-04-10 DIAGNOSIS — R519 Headache, unspecified: Secondary | ICD-10-CM | POA: Diagnosis present

## 2023-04-10 NOTE — ED Triage Notes (Signed)
Patient c/o pain in the back of her head that started on Saturday.  Patient denies sound or light sensitivity. Patient also denies trauma to head.

## 2023-04-11 ENCOUNTER — Emergency Department (HOSPITAL_BASED_OUTPATIENT_CLINIC_OR_DEPARTMENT_OTHER)
Admission: EM | Admit: 2023-04-11 | Discharge: 2023-04-11 | Disposition: A | Discharge status: Home / Self Care | Payer: Medicaid Other | Attending: Emergency Medicine | Admitting: Emergency Medicine

## 2023-04-11 DIAGNOSIS — G4489 Other headache syndrome: Secondary | ICD-10-CM

## 2023-04-11 NOTE — Discharge Instructions (Signed)

## 2023-04-11 NOTE — ED Provider Notes (Signed)
[name] [facility] Provider Note   CSN: [id number] Arrival date & time: 04/10/23  2311     History  Chief Complaint  Patient presents with   Headache    Sheri Lawrence is a 27 y.o. female.  The history is provided by the patient.  Patient reports about a week ago she got her hair braided and started having pain in the back of her head.  It has been intermittent since that time.  She took the braids out but still had episodes of headache.  No fevers or vomiting.  No visual changes.  No slurred speech or weakness.  No recent head trauma.  She is now pain-free. No one else at home has the symptoms.  She has had a headache in multiple locations including at work No exposures at work that could trigger the headache   Past Medical History:  Diagnosis Date   Anemia    Anxiety    Depression    currently in therapy   Fibroid    UTI (urinary tract infection)     Home Medications Prior to Admission medications   Medication Sig Start Date End Date Taking? Authorizing Provider  ferrous sulfate 325 (65 FE) MG EC tablet Take 1 tablet (325 mg total) by mouth every other day. 12/13/20   Gerrit Heck, CNM  Magnesium Oxide (MAG-OXIDE) 200 MG TABS Take 2 tablets (400 mg total) by mouth at bedtime. If that amount causes loose stools in the am, switch to 200mg  daily at bedtime. 02/08/21   Bernerd Limbo, CNM  Prenatal Vit-Fe Fumarate-FA (PRENATAL PO) Take 2 tablets by mouth daily. Prenatal Gummies    [provider]  valACYclovir (VALTREX) 1000 MG tablet Take 1 tablet (1,000 mg total) by mouth 3 (three) times daily. 02/04/23   Pollyann Savoy, MD      Allergies    Patient has no known allergies.    Review of Systems   Review of Systems  Constitutional:  Negative for fever.  Eyes:  Negative for visual disturbance.  Gastrointestinal:  Negative for vomiting.  Neurological:  Positive for headaches. Negative for speech difficulty and  weakness.    Physical Exam Updated Vital Signs BP (!) 129/92 (BP Location: Left Arm)   Pulse 87   Temp 98.5 F (36.9 C) (Oral)   Resp 16   Wt 104.3 kg   LMP 03/25/2023 (Exact Date)   SpO2 97%   BMI 40.74 kg/m  Physical Exam CONSTITUTIONAL: Well developed/well nourished HEAD: Normocephalic/atraumatic EYES: EOMI/PERRL, no nystagmus, no ptosis ENMT: Mucous membranes moist NECK: supple no meningeal signs, no bruits SPINE/BACK:entire spine nontender CV: S1/S2 noted, no murmurs/rubs/gallops noted LUNGS: Lungs are clear to auscultation bilaterally, no apparent distress ABDOMEN: soft, nontender, no rebound or guarding GU:no cva tenderness NEURO:Awake/alert, face symmetric, no arm or leg drift is noted Equal 5/5 strength with shoulder abduction, elbow flex/extension, wrist flex/extension in upper extremities and equal hand grips bilaterally Equal 5/5 strength with hip flexion,knee flex/extension, foot dorsi/plantar flexion Cranial nerves 3/4/5/6/12/01/08/11/12 tested and intact Gait normal without ataxia No past pointing Sensation to light touch intact in all extremities EXTREMITIES: pulses normal, full ROM SKIN: warm, color normal PSYCH: no abnormalities of mood noted, alert and oriented to situation  ED Results / Procedures / Treatments   Labs (all labs ordered are listed, but only abnormal results are displayed) Labs Reviewed - No data to display  EKG None  Radiology No results found.  Procedures Procedures  Medications Ordered in ED Medications - No data to display  ED Course/ Medical Decision Making/ A&P                                 Medical Decision Making  This patient presents to the ED for concern of headache, this involves an extensive number of treatment options, and is a complaint that carries with it a high risk of complications and morbidity.  The differential diagnosis includes but is not limited to subarachnoid hemorrhage, intracranial hemorrhage,  meningitis, encephalitis, CVST, temporal arteritis, idiopathic intracranial hypertension, migraine    Social Determinants of Health: Patient's impaired access to primary care and frequent ER use   increases the complexity of managing their presentation  Additional history obtained: Records reviewed  outpatient records reviewed  Test Considered: I considered neuroimaging, patient is now back to baseline without headache, will defer for now  Complexity of problems addressed: Patient's presentation is most consistent with  acute presentation with potential threat to life or bodily function  Disposition: After consideration of the diagnostic results and the patient's response to treatment,  I feel that the patent would benefit from discharge   .           Final Clinical Impression(s) / ED Diagnoses Final diagnoses:  Other headache syndrome    Rx / DC Orders ED Discharge Orders          Ordered    Ambulatory referral to Neurology       Comments: An appointment is requested in approximately: 4 weeks   04/11/23 0249              Zadie Rhine, MD 04/11/23 0301

## 2023-04-15 ENCOUNTER — Ambulatory Visit: Payer: Medicaid Other | Admitting: Neurology

## 2023-04-15 ENCOUNTER — Encounter: Payer: Self-pay | Admitting: Neurology

## 2023-04-15 VITALS — BP 115/68 | HR 94 | Ht 63.0 in | Wt 235.0 lb

## 2023-04-15 DIAGNOSIS — R519 Headache, unspecified: Secondary | ICD-10-CM

## 2023-04-15 DIAGNOSIS — G43009 Migraine without aura, not intractable, without status migrainosus: Secondary | ICD-10-CM

## 2023-04-15 DIAGNOSIS — H539 Unspecified visual disturbance: Secondary | ICD-10-CM

## 2023-04-15 DIAGNOSIS — G4719 Other hypersomnia: Secondary | ICD-10-CM | POA: Diagnosis not present

## 2023-04-15 DIAGNOSIS — R51 Headache with orthostatic component, not elsewhere classified: Secondary | ICD-10-CM | POA: Diagnosis not present

## 2023-04-15 MED ORDER — RIZATRIPTAN BENZOATE 10 MG PO TBDP: 10.0000 mg | ORAL_TABLET | ORAL | 11 refills | Status: DC | PRN

## 2023-04-15 MED ORDER — METHYLPREDNISOLONE 4 MG PO TBPK: ORAL_TABLET | ORAL | 1 refills | Status: DC

## 2023-04-15 NOTE — Patient Instructions (Addendum)
- A headache that feels like pressure on the head, particularly when wearing a tight ponytail, is often called a "ponytail headache" - a type of external compression headache caused by traction on the scalp from pulling hair too tightly back, resulting in pain at the site of the hair tie or around the hairline.  Key points about ponytail headaches: Mechanism: When a tight ponytail pulls on the scalp, it irritates sensitive nerve endings, causing pain.  Symptoms: A feeling of pressure or tightness on the head, often localized around the hairline where the ponytail is secured.  Treatment: Simply loosen or remove the ponytail to relieve the pressure  - Sleep doctor: eval for sleep study - MRI brain w/wo contrast - At [facility]: Rizatriptan: Please take one tablet at the onset of your headache. If it does not improve the symptoms please take one additional tablet. Do not take more then 2 tablets in 24hrs. Do not take use more then 2 to 3 times in a week. 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 Your tongue or soft tissue in your throat may block the flow of air while you sleep. You may have shallow breathing or stop breathing for short periods of time. People with sleep apnea may snore loudly. There are three kinds of sleep apnea: Obstructive sleep apnea. This kind is caused by a blocked or collapsed airway. This is the most common. Central sleep apnea. This kind happens when the part of the brain that controls breathing does not send the correct signals to the muscles that control breathing. Mixed sleep apnea. This is a combination of obstructive and central sleep apnea. What are the causes? The most common cause of sleep apnea is a collapsed or blocked airway. What increases the risk? Being very overweight. Having family members with sleep apnea. Having a tongue or tonsils that are  larger than normal. Having a small airway or jaw problems. Being older. What are the signs or symptoms? Loud snoring. Restless sleep. Trouble staying asleep. Being sleepy or tired during the day. Waking up gasping or choking. Having a headache in the morning. Mood swings. Having a hard time remembering things and concentrating. How is this diagnosed? A medical history. A physical exam. A sleep study. This is also called a polysomnography test. This test is done at a sleep lab or in your home while you are sleeping. How is this treated? Treatment may include: Sleeping on your side. Losing weight if you're overweight. Wearing an oral appliance. This is a mouthpiece that moves your lower jaw forward. Using a positive airway pressure (PAP) device to keep your airways open while you sleep, such as: A continuous positive airway pressure (CPAP) device. This device gives forced air through a mask when you breathe out. This keeps your airways open. A bilevel positive airway pressure (BIPAP) device. This device gives forced air through a mask when you breathe in and when you breathe out to keep your airways open. Having surgery if other treatments do not work. If your sleep apnea is not treated, you may be at risk for: Heart failure. Heart attack. Stroke. Type 2 diabetes or a problem with your blood sugar called insulin resistance. Follow these instructions at home: Medicines Take your medicines only as told by your health care provider. Avoid alcohol, medicines to help you relax, and certain pain medicines. These may make sleep  apnea worse. General instructions Do not smoke, vape, or use products with nicotine or tobacco in them. If you need help quitting, talk with your provider. If you were given a PAP device to open your airway while you sleep, use it as told by your provider. If you're having surgery, make sure to tell your provider you have sleep apnea. You may need to bring your PAP  device with you. Contact a health care provider if: The PAP device that you were given to use during sleep bothers you or does not seem to be working. You do not feel better or you feel worse. Get help right away if: You have trouble breathing. You have chest pain. You have trouble talking. One side of your body feels weak. A part of your face is hanging down. These symptoms may be an emergency. Call 911 right away. Do not wait to see if the symptoms will go away. Do not drive yourself to the hospital. This information is not intended to replace advice given to you by your health care provider. Make sure you discuss any questions you have with your health care provider. Document Revised: 07/17/2022 Document Reviewed: 07/17/2022 Elsevier Patient Education  2024 [employer] Migraine Headache A migraine headache is an intense pulsing or throbbing pain on one or both sides of the head. Migraine headaches may also cause other symptoms, such as nausea, vomiting, and sensitivity to light and noise. A migraine headache can last from 4 hours to 3 days. Talk with your health care provider about what things may bring on (trigger) your migraine headaches. What are the causes? The exact cause is not known. However, a migraine may be caused when nerves in the brain get irritated and release chemicals that cause blood vessels to become inflamed. This inflammation causes pain. Migraines may be triggered or caused by: Smoking. Medicines, such as: Nitroglycerin, which is used to treat chest pain. Birth control pills. Estrogen. Certain blood pressure medicines. Foods or drinks that contain nitrates, glutamate, aspartame, MSG, or tyramine. Certain foods or drinks, such as aged cheeses, chocolate, alcohol, or caffeine. Doing physical activity that is very hard. Other triggers may include: Menstruation. Pregnancy. Hunger. Stress. Getting too much or too little sleep. Weather changes. Tiredness  (fatigue). What increases the risk? The following factors may make you more likely to have migraine headaches: Being between the ages of 42-13 years old. Being female. Having a family history of migraine headaches. Being Caucasian. Having a mental health condition, such as depression or anxiety. Being obese. What are the signs or symptoms? The main symptom of this condition is pulsing or throbbing pain. This pain may: Happen in any area of the head, such as on one or both sides. Make it hard to do daily activities. Get worse with physical activity. Get worse around bright lights, loud noises, or smells. Other symptoms may include: Nausea. Vomiting. Dizziness. Before a migraine headache starts, you may get warning signs (an aura). An aura may include: Seeing flashing lights or having blind spots. Seeing bright spots, halos, or zigzag lines. Having tunnel vision or blurred vision. Having numbness or a tingling feeling. Having trouble talking. Having muscle weakness. After a migraine ends, you may have symptoms. These may include: Feeling tired. Trouble concentrating. How is this diagnosed? A migraine headache can be diagnosed based on: Your symptoms. A physical exam. Tests, such as: A CT scan or an MRI of the head. These tests can help rule out other causes of headaches. Taking fluid  from the spine (lumbar puncture) to examine it (cerebrospinal fluid analysis, or CSF analysis). How is this treated? This condition may be treated with medicines that: Relieve pain and nausea. Prevent migraines. Treatment may also include: Acupuncture. Lifestyle changes like avoiding foods that trigger migraine headaches. Learning ways to control your body (biofeedback). Talk therapy to help you know and deal with negative thoughts (cognitive behavioral therapy). Follow these instructions at home: Medicines Take over-the-counter and prescription medicines only as told by your provider. Ask  your provider if the medicine prescribed to you: Requires you to avoid driving or using machinery. Can cause constipation. You may need to take these actions to prevent or treat constipation: Drink enough fluid to keep your pee (urine) pale yellow. Take over-the-counter or prescription medicines. Eat foods that are high in fiber, such as beans, whole grains, and fresh fruits and vegetables. Limit foods that are high in fat and processed sugars, such as fried or sweet foods. Lifestyle  Do not drink alcohol. Do not use any products that contain nicotine or tobacco. These products include cigarettes, chewing tobacco, and vaping devices, such as e-cigarettes. If you need help quitting, ask your provider. Get 7-9 hours of sleep each night, or the amount recommended by your provider. Find ways to manage stress, such as meditation, deep breathing, or yoga. Try to exercise regularly. This can help lessen how bad and how often your migraines occur. General instructions Keep a journal to find out what triggers your migraines, so you can avoid those things. For example, write down: What you eat and drink. How much sleep you get. Any change to your diet or medicines. If you have a migraine headache: Avoid things that make your symptoms worse, such as bright lights. Lie down in a dark, quiet room. Do not drive or use machinery. Ask your provider what activities are safe for you while you have symptoms. Keep all follow-up visits. Your provider will monitor your symptoms and recommend any further treatment. Where to find more information Coalition for Headache and Migraine Patients (CHAMP): headachemigraine.org American Migraine Foundation: americanmigrainefoundation.org National Headache Foundation: headaches.org Contact a health care provider if: You have symptoms that are different or worse than your usual migraine headache symptoms. You have more than 15 days of headaches in one month. Get help  right away if: Your migraine headache becomes severe or lasts more than 72 hours. You have a fever or stiff neck. You have vision loss. Your muscles feel weak or like you cannot control them. You lose your balance often or have trouble walking. You faint. You have a seizure. This information is not intended to replace advice given to you by your health care provider. Make sure you discuss any questions you have with your health care provider. Document Revised: 01/06/2022 Document Reviewed: 01/06/2022 Elsevier Patient Education  2024 Else[employer] Inc.  Methylprednisolone Tablets What is this medication? METHYLPREDNISOLONE (meth ill pred NISS oh lone) treats many conditions such as asthma, allergic reactions, arthritis, inflammatory bowel diseases, adrenal, and blood or bone marrow disorders. It works by decreasing inflammation, slowing down an overactive immune system, or replacing cortisol normally made in the body. Cortisol is a hormone that plays an important role in how the body responds to stress, illness, and injury. It belongs to a group of medications called steroids. This medicine may be used for other purposes; ask your health care provider or pharmacist if you have questions. COMMON BRAND NAME(S): Medrol, Medrol Dosepak What should I tell my care team before  I take this medication? They need to know if you have any of these conditions: Cushing's syndrome Eye disease, vision problems Diabetes Glaucoma Heart disease High blood pressure Infection especially a viral infection, such as chickenpox, cold sores, or herpes Liver disease Mental health conditions Myasthenia gravis Osteoporosis Recent or upcoming vaccine Seizures Stomach or intestine problems Thyroid disease An unusual or allergic reaction to lactose, methylprednisolone, other medications, foods, dyes, or preservatives Pregnant or trying to get pregnant Breastfeeding How should I use this medication? Take this  medication by mouth with a glass of water. Follow the directions on the prescription label. Take this medication with food. If you are taking this medication once a day, take it in the morning. Do not take it more often than directed. Do not suddenly stop taking your medication because you may develop a severe reaction. Your care team will tell you how much medication to take. If your care team wants you to stop the medication, the dose may be slowly lowered over time to avoid any side effects. Talk to your care team about the use of this medication in children. Special care may be needed. Overdosage: If you think you have taken too much of this medicine contact a poison control center or emergency room at once. NOTE: This medicine is only for you. Do not share this medicine with others. What if I miss a dose? If you miss a dose, take it as soon as you can. If it is almost time for your next dose, talk to your care team. You may need to miss a dose or take an extra dose. Do not take double or extra doses without advice. What may interact with this medication? Do not take this medication with any of the following: Alefacept Echinacea Live virus vaccines Metyrapone Mifepristone This medication may also interact with the following: Amphotericin B Aspirin and aspirin-like medications Certain antibiotics, such as erythromycin, clarithromycin, troleandomycin Certain medications for diabetes Certain medications for fungal infections, such as ketoconazole Certain medications for seizures, such as carbamazepine, phenobarbital, phenytoin Certain medications that treat or prevent blood clots, such as warfarin Cholestyramine Cyclosporine Digoxin Diuretics Estrogen or progestin hormones Isoniazid NSAIDs, medications for pain and inflammation, such as ibuprofen or naproxen Other medications for myasthenia gravis Rifampin Vaccines This list may not describe all possible interactions. Give your health  care provider a list of all the medicines, herbs, non-prescription drugs, or dietary supplements you use. Also tell them if you smoke, drink alcohol, or use illegal drugs. Some items may interact with your medicine. What should I watch for while using this medication? Tell your care team if your symptoms do not start to get better or if they get worse. Do not stop taking except on your care team's advice. You may develop a severe reaction. Your care team will tell you how much medication to take. This medication may increase your risk of getting an infection. Tell your care team if you are around anyone with measles or chickenpox, or if you develop sores or blisters that do not heal properly. This medication may increase blood sugar levels. Ask your care team if changes in diet or medications are needed if you have diabetes. Tell your care team right away if you have any change in your eyesight. Using this medication for a long time may increase your risk of low bone mass. Talk to your care team about bone health. What side effects may I notice from receiving this medication? Side effects that you should  report to your care team as soon as possible: Allergic reactions--skin rash, itching, hives, swelling of the face, lips, tongue, or throat Cushing syndrome--increased fat around the midsection, upper back, neck, or face, pink or purple stretch marks on the skin, thinning, fragile skin that easily bruises, unexpected hair growth High blood sugar (hyperglycemia)--increased thirst or amount of urine, unusual weakness or fatigue, blurry vision Increase in blood pressure Infection--fever, chills, cough, sore throat, wounds that don't heal, pain or trouble when passing urine, general feeling of discomfort or being unwell Low adrenal gland function--nausea, vomiting, loss of appetite, unusual weakness or fatigue, dizziness Mood and behavior changes--anxiety, nervousness, confusion, hallucinations,  irritability, hostility, thoughts of suicide or self-harm, worsening mood, feelings of depression Stomach bleeding--bloody or black, tar-like stools, vomiting blood or brown material that looks like coffee grounds Swelling of the ankles, hands, or feet Side effects that usually do not require medical attention (report to your care team if they continue or are bothersome): Acne General discomfort and fatigue Headache Increase in appetite Nausea Trouble sleeping Weight gain This list may not describe all possible side effects. Call your doctor for medical advice about side effects. You may report side effects to FDA at 1-800-FDA-1088. Where should I keep my medication? Keep out of the reach of children and pets. Store at room temperature between 20 and 25 degrees C (68 and 77 degrees F). Throw away any unused medication after the expiration date. NOTE: This sheet is a summary. It may not cover all possible information. If you have questions about this medicine, talk to your doctor, pharmacist, or health care provider.  2024 Elsevier/Gold Standard (2022-01-08 00:00:00) Rizatriptan Disintegrating Tablets What is this medication? RIZATRIPTAN (rye za TRIP tan) treats migraines. It works by blocking pain signals and narrowing blood vessels in the brain. It belongs to a group of medications called triptans. It is not used to prevent migraines. This medicine may be used for other purposes; ask your health care provider or pharmacist if you have questions. COMMON BRAND NAME(S): Maxalt-MLT What should I tell my care team before I take this medication? They need to know if you have any of these conditions: Circulation problems in fingers and toes Diabetes Heart disease High blood pressure High cholesterol History of irregular heartbeat History of stroke Stomach or intestine problems Tobacco use An unusual or allergic reaction to rizatriptan, other medications, foods, dyes, or  preservatives Pregnant or trying to get pregnant Breast-feeding How should I use this medication? Take this medication by mouth. Take it as directed on the prescription label. You do not need water to take this medication. Leave the tablet in the sealed pack until you are ready to take it. With dry hands, open the pack and gently remove the tablet. If the tablet breaks or crumbles, throw it away. Use a new tablet. Place the tablet on the tongue and allow it to dissolve. Then, swallow it. Do not cut, crush, or chew this medication. Do not use it more often than directed. Talk to your care team about the use of this medication in children. While it may be prescribed for children as young as 6 years for selected conditions, precautions do apply. Overdosage: If you think you have taken too much of this medicine contact a poison control center or emergency room at once. NOTE: This medicine is only for you. Do not share this medicine with others. What if I miss a dose? This does not apply. This medication is not for regular use. What  may interact with this medication? Do not take this medication with any of the following: Ergot alkaloids, such as dihydroergotamine, ergotamine MAOIs, such as Marplan, Nardil, Parnate Other medications for migraine headache, such as almotriptan, eletriptan, frovatriptan, naratriptan, sumatriptan, zolmitriptan This medication may also interact with the following: Certain medications for depression, anxiety, or other mental health conditions Propranolol This list may not describe all possible interactions. Give your health care provider a list of all the medicines, herbs, non-prescription drugs, or dietary supplements you use. Also tell them if you smoke, drink alcohol, or use illegal drugs. Some items may interact with your medicine. What should I watch for while using this medication? Visit your care team for regular checks on your progress. Tell your care team if your  symptoms do not start to get better or if they get worse. This medication may affect your coordination, reaction time, or judgment. Do not drive or operate machinery until you know how this medication affects you. Sit up or stand slowly to reduce the risk of dizzy or fainting spells. If you take migraine medications for 10 or more days a month, your migraines may get worse. Keep a diary of headache days and medication use. Contact your care team if your migraine attacks occur more frequently. What side effects may I notice from receiving this medication? Side effects that you should report to your care team as soon as possible: Allergic reactions--skin rash, itching, hives, swelling of the face, lips, tongue, or throat Burning, pain, tingling, or color changes in the hands, arms, legs, or feet Heart attack--pain or tightness in the chest, shoulders, arms, or jaw, nausea, shortness of breath, cold or clammy skin, feeling faint or lightheaded Heart rhythm changes--fast or irregular heartbeat, dizziness, feeling faint or lightheaded, chest pain, trouble breathing Increase in blood pressure Irritability, confusion, fast or irregular heartbeat, muscle stiffness, twitching muscles, sweating, high fever, seizure, chills, vomiting, diarrhea, which may be signs of serotonin syndrome Raynaud syndrome--cool, numb, or painful fingers or toes that may change color from pale, to blue, to red Seizures Stroke--sudden numbness or weakness of the face, arm, or leg, trouble speaking, confusion, trouble walking, loss of balance or coordination, dizziness, severe headache, change in vision Sudden or severe stomach pain, bloody diarrhea, fever, nausea, vomiting Vision loss Side effects that usually do not require medical attention (report to your care team if they continue or are bothersome): Dizziness Unusual weakness or fatigue This list may not describe all possible side effects. Call your doctor for medical advice  about side effects. You may report side effects to FDA at 1-800-FDA-1088. Where should I keep my medication? Keep out of the reach of children and pets. Store at room temperature between 15 and 30 degrees C (59 and 86 degrees F). Protect from light and moisture. Get rid of any unused medication after the expiration date. To get rid of medications that are no longer needed or have expired: Take the medication to a medication take-back program. Check with your pharmacy or law enforcement to find a location. If you cannot return the medication, check the label or package insert to see if the medication should be thrown out in the garbage or flushed down the toilet. If you are not sure, ask your care team. If it is safe to put it in the trash, empty the medication out of the container. Mix the medication with cat litter, dirt, coffee grounds, or other unwanted substance. Seal the mixture in a bag or container. Put it in  the trash. NOTE: This sheet is a summary. It may not cover all possible information. If you have questions about this medicine, talk to your doctor, pharmacist, or health care provider.  2024 Elsevier/Gold Standard (2021-09-12 00:00:00)

## 2023-04-15 NOTE — Progress Notes (Signed)
[facility]    Provider:  Dr Lucia Gaskins Requesting Provider: Zadie Rhine, MD Primary Care Provider:  Pcp, No  CC:  Headache  HPI:  Sheri Lawrence is a 27 y.o. female here as requested by Zadie Rhine, MD for headache after getting hair done. has Supervision of normal first pregnancy, antepartum; Iron deficiency anemia; Back pain affecting pregnancy in third trimester; Fibroids; and Indication for care in labor and delivery, antepartum on their problem list. She had tight braids and started getting headaches in that area and getting more migraines. The pain was in the back of the head, where the tightness was, improved after taking it out the headache is gone but she still has headaches/,migraines since then for 11 days. She has a baseline migraine frequency of 4 migraine days a month and 10 total headache days a month. Migraines unilateral, pulsating/pounding/throbbing, light sensitivity and nausea, can last 4-24 hours, hurts to move, a dark room helps. She wakes up with headaches a lot, snores, excessivelt tired during the day. Morning headaches, worse in the mornings. Blurry vision,positional headache worse in the morning, worsening headaches, vision changes.. No other focal neurologic deficits, associated symptoms, inciting events or modifiable factors.    Reviewed notes, labs and imaging from outside physicians, which showed: propranolol and BP medications contraindicated due to low blood pressure, amitriptyline contraindicated due to sleep apnea, start Topiramate   Reviewed chart, patient was seen in the emergency room 04/10/2019 for, for headache for over a week, after she had her hair braided and started having pain in the back of her head, intermittent since then but daily, she took the braids up is still having episodes of headaches, no fevers or vomiting, no recent head trauma, headaches in multiple locations including at work, no exposures at work that could  trigger the headache, evaluation was unremarkable including physical exam and neurologic exam reviewed, she has impaired access to primary care and she uses the ER frequently.  They did not feel at the time there is a need for neuroimaging, refer to outpatient neurology.  Reviewed the rest of patient's chart over the last year and did not see anything else of significance.  Recent Results (from the past 2160 hour(s))  GC/Chlamydia probe amp     Status: None   Collection Time: 02/03/23 11:29 PM  Result Value Ref Range   Neisseria Gonorrhea Negative    Chlamydia Negative    Comment Normal Reference Ranger Chlamydia - Negative    Comment      Normal Reference Range Neisseria Gonorrhea - Negative  Wet prep, genital     Status: Abnormal   Collection Time: 02/03/23 11:48 PM   Specimen: PATH Cytology Cervicovaginal Ancillary Only  Result Value Ref Range   Yeast Wet Prep HPF POC NONE SEEN NONE SEEN   Trich, Wet Prep NONE SEEN NONE SEEN   Clue Cells Wet Prep HPF POC NONE SEEN NONE SEEN   WBC, Wet Prep HPF POC >=10 (A) <10   Sperm NONE SEEN     Comment: Performed at [facility], [address] [place], [place] [address]  Hsv Culture And Typing     Status: Abnormal   Collection Time: 02/03/23 11:48 PM   Specimen: PATH Cytology Cervicovaginal Ancillary Only; Other  Result Value Ref Range   HSV Culture/Type Comment (A)     Comment: (NOTE) Positive for Herpes simplex virus type-2. Typing was confirmed by monoclonal antibody microscopic immunofluorescence. Performed At: [facility] [employer] [address]  [place], [place] [id number] Jolene Schimke MD WG:[id number]    Source of Sample LABIA     Comment: Performed at [facility], [address] [place], [place] [address]  Urinalysis, Routine w reflex microscopic -Urine, Clean Catch     Status: Abnormal   Collection Time: 02/03/23 11:49 PM  Result Value Ref Range   Color, Urine YELLOW YELLOW   APPearance HAZY (A) CLEAR    Specific Gravity, Urine >=1.030 1.005 - 1.030   pH 7.0 5.0 - 8.0   Glucose, UA NEGATIVE NEGATIVE mg/dL   Hgb urine dipstick TRACE (A) NEGATIVE   Bilirubin Urine NEGATIVE NEGATIVE   Ketones, ur NEGATIVE NEGATIVE mg/dL   Protein, ur 629 (A) NEGATIVE mg/dL   Nitrite NEGATIVE NEGATIVE   Leukocytes,Ua NEGATIVE NEGATIVE    Comment: Performed at [facility], [address] [place], [place] [address]  Pregnancy, urine     Status: None   Collection Time: 02/03/23 11:49 PM  Result Value Ref Range   Preg Test, Ur NEGATIVE NEGATIVE    Comment:        THE SENSITIVITY OF THIS METHODOLOGY IS >25 mIU/mL. Performed at [facility], [address] [place], [place] [address]   Urinalysis, Microscopic (reflex)     Status: Abnormal   Collection Time: 02/03/23 11:49 PM  Result Value Ref Range   RBC / HPF 0-5 0 - 5 RBC/hpf   WBC, UA 0-5 0 - 5 WBC/hpf   Bacteria, UA RARE (A) NONE SEEN   Squamous Epithelial / HPF 0-5 0 - 5 /HPF   Mucus PRESENT     Comment: Performed at [facility], [address] [place], [place] [address]     Review of Systems: Patient complains of symptoms per HPI as well as the following symptoms None. Pertinent negatives and positives per HPI. All others negative.   Social History   Socioeconomic History   Marital status: Significant Other    Spouse name: Not on file   Number of children: Not on file   Years of education: Not on file   Highest education level: Bachelor's degree (e.g., BA, AB, BS)  Occupational History   Not on file  Tobacco Use   Smoking status: Never   Smokeless tobacco: Never  Vaping Use   Vaping status: Never Used  Substance and Sexual Activity   Alcohol use: Not Currently   Drug use: Never   Sexual activity: Not Currently    Birth control/protection: None  Other Topics Concern   Not on file  Social History Narrative   Not on file   Social Determinants of Health   Financial Resource Strain: Not on  file  Food Insecurity: Not on file  Transportation Needs: Not on file  Physical Activity: Not on file  Stress: Not on file  Social Connections: Not on file  Intimate Partner Violence: Not on file    Family History  Problem Relation Age of Onset   Healthy Mother    Depression Father    Anxiety disorder Father    Other Father 26       "died in his sleep"   Alcohol abuse Maternal Grandfather    Migraines Neg Hx     Past Medical History:  Diagnosis Date   Anemia    Anxiety    Depression    currently in therapy   Fibroid    UTI (urinary tract infection)     Patient Active  Problem List   Diagnosis Date Noted   Fibroids 02/12/2021   Indication for care in labor and delivery, antepartum 02/12/2021   Back pain affecting pregnancy in third trimester 02/08/2021   Iron deficiency anemia 12/08/2020   Supervision of normal first pregnancy, antepartum 08/21/2020    Past Surgical History:  Procedure Laterality Date   TONSILLECTOMY      Current Outpatient Medications  Medication Sig Dispense Refill   methylPREDNISolone (MEDROL DOSEPAK) 4 MG TBPK tablet Take pills daily all together with food. Take the first dose (6 pills) as soon as possible. Take the rest each morning. For 6 days total 6-5-4-3-2-1. 21 tablet 1   rizatriptan (MAXALT-MLT) 10 MG disintegrating tablet Take 1 tablet (10 mg total) by mouth as needed for migraine. May repeat in 2 hours if needed 9 tablet 11   No current facility-administered medications for this visit.    Allergies as of 04/15/2023   (No Known Allergies)    Vitals: BP 115/68   Pulse 94   Ht 5\' 3"  (1.6 m)   Wt 235 lb (106.6 kg)   LMP 03/25/2023 (Exact Date)   BMI 41.63 kg/m  Last Weight:  Wt Readings from Last 1 Encounters:  04/15/23 235 lb (106.6 kg)   Last Height:   Ht Readings from Last 1 Encounters:  04/15/23 5\' 3"  (1.6 m)     Physical exam: Exam: Gen: NAD, conversant, well nourised, obese, well groomed                     CV:  RRR, no MRG. No Carotid Bruits. No peripheral edema, warm, nontender Eyes: Conjunctivae clear without exudates or hemorrhage  Neuro: Detailed Neurologic Exam  Speech:    Speech is normal; fluent and spontaneous with normal comprehension.  Cognition:    The patient is oriented to person, place, and time;     recent and remote memory intact;     language fluent;     normal attention, concentration,     fund of knowledge Cranial Nerves:    The pupils are equal, round, and reactive to light. The fundi are normal and spontaneous venous pulsations are present. Visual fields are full to finger confrontation. Extraocular movements are intact. Trigeminal sensation is intact and the muscles of mastication are normal. The face is symmetric. The palate elevates in the midline. Hearing intact. Voice is normal. Shoulder shrug is normal. The tongue has normal motion without fasciculations.   Coordination:    Normal finger to nose and heel to shin. Normal rapid alternating movements.   Gait:    Heel-toe and tandem gait are normal.   Motor Observation:    No asymmetry, no atrophy, and no involuntary movements noted. Tone:    Normal muscle tone.    Posture:    Posture is normal. normal erect    Strength:    Strength is V/V in the upper and lower limbs.      Sensation: intact to LT     Reflex Exam:  DTR's:    Deep tendon reflexes in the upper and lower extremities are normal bilaterally.   Toes:    The toes are downgoing bilaterally.   Clonus:    Clonus is absent.    Assessment/Plan:  Patient with migraine without aura after getting tight braids  - A headache that feels like pressure on the head, particularly when wearing a tight ponytail, is often called a "ponytail headache" - a type of external compression headache caused by  traction on the scalp from pulling hair too tightly back, resulting in pain at the site of the hair tie or around the hairline.  Key points about ponytail  headaches:Mechanism: When a tight ponytail pulls on the scalp, it irritates sensitive nerve endings, causing pain. Symptoms: A feeling of pressure or tightness on the head, often localized around the hairline where the ponytail is secured. Treatment: Simply loosen or remove the ponytail to relieve the pressure  - Sleep doctor: eval for sleep study: She wakes up with headaches a lot, snores, excessivelt tired during the day. Morning headaches, worse in the mornings. Obese BMI 41  - MRI brain w/wo contrast: MRI brain due to concerning symptoms of morning headaches, positional headaches,vision changes, worsening headaches  to look for space occupying mass, chiari or intracranial hypertension (pseudotumor), strokes, malignancies, vasculidities, demyelination(multiple sclerosis) or other  - At ONSET OF MIGRAINE: Rizatriptan: Please take one tablet at the onset of your headache. If it does not improve the symptoms please take one additional tablet. Do not take more then 2 tablets in 24hrs. Do not take use more then 2 to 3 times in a week.  - 6 days of steroids to help with status migrainosus  Orders Placed This Encounter  Procedures   MR BRAIN W WO CONTRAST   Ambulatory referral to Sleep Studies   Meds ordered this encounter  Medications   methylPREDNISolone (MEDROL DOSEPAK) 4 MG TBPK tablet    Sig: Take pills daily all together with food. Take the first dose (6 pills) as soon as possible. Take the rest each morning. For 6 days total 6-5-4-3-2-1.    Dispense:  21 tablet    Refill:  1   rizatriptan (MAXALT-MLT) 10 MG disintegrating tablet    Sig: Take 1 tablet (10 mg total) by mouth as needed for migraine. May repeat in 2 hours if needed    Dispense:  9 tablet    Refill:  11    Cc: Zadie Rhine, MD,  Pcp, No  Naomie Dean, MD  [facility] [address] [place], [place] [address]  Phone [phone] Fax [phone]

## 2023-04-19 ENCOUNTER — Encounter: Payer: Self-pay | Admitting: Neurology

## 2023-04-21 ENCOUNTER — Telehealth: Payer: Self-pay | Admitting: Neurology

## 2023-04-21 NOTE — Telephone Encounter (Signed)
[facility] [place]: [id number] exp. 04/21/23-06/19/23 sent to GI [phone]

## 2023-05-13 ENCOUNTER — Ambulatory Visit (INDEPENDENT_AMBULATORY_CARE_PROVIDER_SITE_OTHER): Payer: Medicaid Other | Admitting: Neurology

## 2023-05-13 ENCOUNTER — Encounter: Payer: Self-pay | Admitting: Neurology

## 2023-05-13 VITALS — BP 111/69 | HR 89 | Ht 63.0 in | Wt 238.6 lb

## 2023-05-13 DIAGNOSIS — R0683 Snoring: Secondary | ICD-10-CM

## 2023-05-13 DIAGNOSIS — G4726 Circadian rhythm sleep disorder, shift work type: Secondary | ICD-10-CM | POA: Diagnosis not present

## 2023-05-13 DIAGNOSIS — G43009 Migraine without aura, not intractable, without status migrainosus: Secondary | ICD-10-CM

## 2023-05-13 DIAGNOSIS — R519 Headache, unspecified: Secondary | ICD-10-CM | POA: Insufficient documentation

## 2023-05-13 NOTE — Patient Instructions (Addendum)
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  Has migraines for many years , responding well to rizatriptan.   2) Has some morning headaches , these can be related to sleep hypoxia and will be checking for OSA based on this. Risk factors for OSA include BMI of  42 , neck size and upper air way anatomy.  3) occipital  tension headaches have resolved.   4) EDS/ The patient is excessively daytime sleepy, but not fatigued. She has only 5 hours of total sleep time on average, related to child care demands and shift work, she has no chance to get to bed and I to sleep before midnight.    5)Caffeine use in form of caffeinated Sodas.   HST ordered, will follow in 2-4 months for treatment options if OSA is identified.  .   No orders of the defined types were placed in this encounter.     I would like to thank Anson Fret, MD for this referral.   NO Pcp, [facility]

## 2023-05-13 NOTE — Progress Notes (Signed)
SLEEP MEDICINE CLINIC    Provider:  Melvyn Novas, MD  Primary Care Physician:  Ihor Gully ,    Referring Provider: Anson Fret, Md [address] [place],  [place] [address]          Chief Complaint according to patient   Patient presents with:     New Sleep Patient (Initial Visit)           HISTORY OF PRESENT ILLNESS:  Sheri Lawrence is a 27 y.o. female patient who is seen upon referral by Dr Lucia Gaskins on 05/13/2023 for a Sleep medicine evaluation, screening for OSA as a possible contributor to headache disorder.  Chief concern according to patient : Is after she reports that she had seen Dr. Daisy Blossom upon an emergency room referral via Dr. Zadie Rhine.  This was for headaches after she had a visit with her hairstylist and had very tight braids placed.  The patient had migraines in the past but these headaches were different and the location for the headaches was different.  She describes an occipital neuralgic pain, and certainly the whole on her scalp at that region could have contributed to it.  She is no longer having the same has started now and her headaches improved.  The patient has a history of iron deficiency anemia she had back pain in the past during the third trimester of pregnancy.  She has a history of fibroids.  Dr. Daisy Blossom described that the pain was in the back of the head where the tightness was improved after the taking the braids out and that the specific headache that came on with a hairstyle is gone -  but she still has headaches of other kind and migraines about 10 to 12 days a months. She wakes up with headaches frequently, those are dull , not pounding, not pulsatile, and go away after she takes ibuprofen po.  She has migraines , affecting the left or right temple, 1-2 a month. No aura. No nausea,  photophobia. These can last a day or longer if she doesn't take medication. She was prescribed Rizatriptan ( maxalt) which works when taken early.      I have the pleasure of seeing Sheri Lawrence [birth year] a right-handed female with a possible sleep disorder.  No sleep study previously.       Sleep relevant  ROS and medical history: Snoring , Nocturia 1-2,  no Sleep walking, Tonsillectomy at age 83-8,   Family medical /sleep history: no  other family member on CPAP with OSA.   Social history:  Patient is working as  a Geographical information systems officer and lives in a household with 47 child, 92 year old daughter.  The patient currently works second shift 3 Pm to 11 Pm-  used to work in shifts( Chief Technology Officer,) Pets are not present.  Tobacco use: none   ETOH use ; none ,  Caffeine intake in form of Coffee( /) Soda( 4 glasses a day) Tea ( /) , and powerade energy drinks Exercise; non routine     Sleep habits are as follows: The patient's dinner time is between 7.30-8 PM, at work . The patient goes to bed at 1-2 AM  and continues to sleep for 5 total  hours, wakes for 1-2 bathroom breaks.   The bedroom is described as cool, quiet , and dark.  The preferred sleep position is supine , laterally, with the support of 1 firm pillow.  Dreams are reportedly present , vivid  at times    The patient wakes up spontaneously at "Kind of sort of ", without an alarm. 5-7  AM is the usual rise time.  She reports not feeling refreshed or restored in AM, with symptoms such as dry mouth, morning headaches, and residual fatigue.  Naps are taken frequently, lasting from 30 to 60 minutes and are refreshing . Cam be a nap in AM or PM.    Review of Systems: Out of a complete 14 system review, the patient complains of only the following symptoms, and all other reviewed systems are negative.:  Fatigue, sleepiness , snoring, shift work , restricted sleep hours .   How likely are you to doze in the following situations: 0 = not likely, 1 = slight chance, 2 = moderate chance, 3 = high chance   Sitting and Reading? Watching Television? Sitting inactive in a public place  (theater or meeting)? As a passenger in a car for an hour without a break? Lying down in the afternoon when circumstances permit? Sitting and talking to someone? Sitting quietly after lunch without alcohol? In a car, while stopped for a few minutes in traffic?   Total = 10/ 24 points   FSS endorsed at 9/ 63 points.   Social History   Socioeconomic History   Marital status: Significant Other    Spouse name: Not on file   Number of children: 1 daughter born 2022.    Years of education: Not on file   Highest education level: Bachelor's degree (e.g., BA, AB, BS)  Occupational History                                                    Location manager.   Tobacco Use   Smoking status: Never   Smokeless tobacco: Never  Vaping Use   Vaping status: Never Used  Substance and Sexual Activity   Alcohol use: Not Currently   Drug use: Never   Sexual activity: Not Currently    Birth control/protection: None  Other Topics Concern   Not on file  Social History Narrative   Pt lives with child    Pt works    Social Drivers of Corporate investment banker Strain: Not on Ship broker Insecurity: Not on file  Transportation Needs: Not on file  Physical Activity: Not on file  Stress: Not on file  Social Connections: Not on file    Family History  Problem Relation Age of Onset   Healthy Mother    Depression Father    Anxiety disorder Father    Other Father 29       "died in his sleep"   Alcohol abuse Maternal Grandfather    Migraines Neg Hx     Past Medical History:  Diagnosis Date   Anemia    Anxiety    Depression    currently in therapy   Fibroid    UTI (urinary tract infection)     Past Surgical History:  Procedure Laterality Date   TONSILLECTOMY       Current Outpatient Medications on File Prior to Visit  Medication Sig Dispense Refill   rizatriptan (MAXALT-MLT) 10 MG disintegrating tablet Take 1 tablet (10 mg total) by mouth as needed for migraine. May repeat in 2  hours if needed 9 tablet 11   methylPREDNISolone (MEDROL DOSEPAK) 4  MG TBPK tablet Take pills daily all together with food. Take the first dose (6 pills) as soon as possible. Take the rest each morning. For 6 days total 6-5-4-3-2-1. 21 tablet 1   No current facility-administered medications on file prior to visit.    No Known Allergies   DIAGNOSTIC DATA (LABS, IMAGING, TESTING) - I reviewed patient records, labs, notes, testing and imaging myself where available.  Lab Results  Component Value Date   WBC 9.2 02/12/2021   HGB 10.7 (L) 02/12/2021   HCT 34.6 (L) 02/12/2021   MCV 85.6 02/12/2021   PLT 268 02/12/2021      Component Value Date/Time   NA 135 09/26/2020 1034   K 3.9 09/26/2020 1034   CL 99 09/26/2020 1034   CO2 19 (L) 09/26/2020 1034   GLUCOSE 120 (H) 09/26/2020 1034   GLUCOSE 92 01/05/2020 1613   BUN 6 09/26/2020 1034   CREATININE 0.50 (L) 09/26/2020 1034   CALCIUM 9.2 09/26/2020 1034   PROT 6.9 09/26/2020 1034   ALBUMIN 3.8 (L) 09/26/2020 1034   AST 13 09/26/2020 1034   ALT 8 09/26/2020 1034   ALKPHOS 44 09/26/2020 1034   BILITOT <0.2 09/26/2020 1034   GFRNONAA >60 01/05/2020 1613   GFRAA >60 01/05/2020 1613   No results found for: "CHOL", "HDL", "LDLCALC", "LDLDIRECT", "TRIG", "CHOLHDL" Lab Results  Component Value Date   HGBA1C 5.2 08/22/2020   No results found for: "VITAMINB12" No results found for: "TSH"  PHYSICAL EXAM:  Today's Vitals   05/13/23 0908  BP: 111/69  Pulse: 89  Weight: 238 lb 9.6 oz (108.2 kg)  Height: 5\' 3"  (1.6 m)   Body mass index is 42.27 kg/m.   Wt Readings from Last 3 Encounters:  05/13/23 238 lb 9.6 oz (108.2 kg)  04/15/23 235 lb (106.6 kg)  04/10/23 230 lb (104.3 kg)     Ht Readings from Last 3 Encounters:  05/13/23 5\' 3"  (1.6 m)  04/15/23 5\' 3"  (1.6 m)  02/03/23 5\' 3"  (1.6 m)      General: The patient is awake, alert and appears not in acute distress. The patient is well groomed. Head: Normocephalic,  atraumatic. Neck is supple. Mallampati 3,  neck circumference:16.25" inches . Nasal airflow patent.  Retrognathia is seen.  Dental status: no braces, no overbite.  Cardiovascular:  Regular rate and cardiac rhythm by pulse,  without distended neck veins. Respiratory: Lungs are clear to auscultation.  Skin:  Without evidence of ankle edema, or rash. Trunk: The patient's posture is erect.   NEUROLOGIC EXAM: The patient is awake and alert, oriented to place and time.   Memory subjective described as intact.  Attention span & concentration ability appears normal.  Speech is fluent,  without  dysarthria, dysphonia or aphasia.  Mood and affect are appropriate.   Cranial nerves: no loss of smell or taste reported  Pupils are equal and briskly reactive to light. Funduscopic exam deferred.  Extraocular movements in vertical and horizontal planes were intact and without nystagmus. No Diplopia. Visual fields by finger perimetry are intact. Hearing was intact to soft voice and finger rubbing.    Facial sensation intact to fine touch.  Facial motor strength is symmetric and tongue and uvula move midline.  Neck ROM : rotation, tilt and flexion extension were normal for age and shoulder shrug was symmetrical.    Motor exam:  Symmetric bulk, tone and ROM.   Normal tone without cog wheeling, symmetric grip strength .   Sensory:  Fine touch and vibration were normal.  Proprioception tested in the upper extremities was normal.   Coordination: Rapid alternating movements in the fingers/hands were of normal speed.  The Finger-to-nose maneuver was intact without evidence of ataxia, dysmetria or tremor.   Gait and station: Patient could rise unassisted from a seated position, walked without assistive device.  Deep tendon reflexes: in the  upper and lower extremities are symmetric and intact.  Babinski response was deferred    ASSESSMENT AND PLAN 27 y.o. year old female  here with:      1) mixed  headache disorder.  Has migraines for many years , responding well to rizatriptan.  2) Has some morning headaches , these can be related to sleep hypoxia and will be checking for OSA based on this. Risk factors for OSA include BMI of  42 , neck size and upper air way anatomy.  3) occipital  tension headaches have resolved.   4) The patient is excessively daytime sleepy, but not fatigued. She has only 5 hours of total sleep time on average, related to child care demands and shift work, she has no chance to get to bed and I to sleep before midnight.    5)Caffeine use in form of caffeinated Sodas.   HST ordered, will follow in 2-4 months for treatment options if OSA is identified.  .   I would like to thank Pcp, No and Anson Fret, Md [address] [place],  [place] [address] for allowing me to meet with and to take care of this pleasant patient. .  After spending a total time of  35  minutes face to face and additional time for physical and neurologic examination, review of laboratory studies,  personal review of imaging studies, reports and results of other testing and review of referral information / records as far as provided in visit,   Electronically signed by: Melvyn Novas, MD 05/13/2023 9:12 AM  [facility] Board certified by The [employer] of Sleep Medicine and Diplomate of the [employer] of Sleep Medicine. Board certified In Neurology through the ABPN, Fellow of the [employer] of Neurology.

## 2023-05-18 ENCOUNTER — Telehealth: Payer: Self-pay | Admitting: Neurology

## 2023-05-18 NOTE — Telephone Encounter (Signed)
HST-MCD [facility] pending uploaded notes  

## 2023-05-20 ENCOUNTER — Emergency Department (HOSPITAL_BASED_OUTPATIENT_CLINIC_OR_DEPARTMENT_OTHER)
Admission: EM | Admit: 2023-05-20 | Discharge: 2023-05-20 | Disposition: A | Discharge status: Home / Self Care | Payer: Medicaid Other | Attending: Emergency Medicine | Admitting: Emergency Medicine

## 2023-05-20 ENCOUNTER — Encounter (HOSPITAL_BASED_OUTPATIENT_CLINIC_OR_DEPARTMENT_OTHER): Payer: Self-pay

## 2023-05-20 ENCOUNTER — Other Ambulatory Visit: Payer: Self-pay

## 2023-05-20 DIAGNOSIS — M533 Sacrococcygeal disorders, not elsewhere classified: Secondary | ICD-10-CM | POA: Insufficient documentation

## 2023-05-20 DIAGNOSIS — M7918 Myalgia, other site: Secondary | ICD-10-CM

## 2023-05-20 HISTORY — DX: Herpesviral infection of urogenital system, unspecified: A60.00

## 2023-05-20 HISTORY — DX: Migraine, unspecified, not intractable, without status migrainosus: G43.909

## 2023-05-20 MED ORDER — IBUPROFEN 800 MG PO TABS
800.0000 mg | ORAL_TABLET | Freq: Once | ORAL | Status: AC
  Administered 2023-05-20: 800 mg via ORAL
  Filled 2023-05-20: qty 1

## 2023-05-20 MED ORDER — LIDOCAINE 5 % EX PTCH: 1.0000 | MEDICATED_PATCH | CUTANEOUS | 0 refills | Status: DC

## 2023-05-20 MED ORDER — LIDOCAINE 5 % EX PTCH
1.0000 | MEDICATED_PATCH | CUTANEOUS | Status: DC
  Administered 2023-05-20: 1 via TRANSDERMAL
  Filled 2023-05-20: qty 1

## 2023-05-20 MED ORDER — METHOCARBAMOL 750 MG PO TABS: 750.0000 mg | ORAL_TABLET | Freq: Three times a day (TID) | ORAL | 0 refills | Status: DC | PRN

## 2023-05-20 NOTE — ED Provider Notes (Signed)
[place] EMERGENCY DEPARTMENT AT [facility] Provider Note   CSN: [id number] Arrival date & time: 05/20/23  1701     History  Chief Complaint  Patient presents with   Tailbone Pain    Sheri Lawrence is a 27 y.o. female presents today for right sided back/buttock pain that started yesterday.  Patient states she was sitting on a carpeted floor playing with her daughter prior to pain onset.  Patient denies injury, weakness, loss of bowel or bladder, saddle anesthesia, fever, nausea, or radiation.  Patient did try taking ibuprofen and Flexeril with minimal relief.  HPI     Home Medications Prior to Admission medications   Medication Sig Start Date End Date Taking? Authorizing Provider  lidocaine (LIDODERM) 5 % Place 1 patch onto the skin daily. Remove & Discard patch within 12 hours or as directed by MD 05/20/23  Yes Dolphus Jenny, PA-C  rizatriptan (MAXALT-MLT) 10 MG disintegrating tablet Take 1 tablet (10 mg total) by mouth as needed for migraine. May repeat in 2 hours if needed 04/15/23  Yes Anson Fret, MD  valACYclovir (VALTREX) 1000 MG tablet Take 1,000 mg by mouth every 8 (eight) hours. 05/19/23  Yes [provider]  methocarbamol (ROBAXIN) 750 MG tablet Take 1 tablet (750 mg total) by mouth every 8 (eight) hours as needed for muscle spasms. 05/20/23   Dolphus Jenny, PA-C  methylPREDNISolone (MEDROL DOSEPAK) 4 MG TBPK tablet Take pills daily all together with food. Take the first dose (6 pills) as soon as possible. Take the rest each morning. For 6 days total 6-5-4-3-2-1. 04/15/23   Anson Fret, MD      Allergies    Patient has no known allergies.    Review of Systems   Review of Systems  Musculoskeletal:  Positive for back pain.    Physical Exam Updated Vital Signs BP (!) 141/93   Pulse (!) 106   Temp 98.4 F (36.9 C) (Oral)   Resp 16   Ht 5\' 3"  (1.6 m)   Wt 108.9 kg   LMP 04/28/2023   SpO2 100%   Breastfeeding No   BMI 42.51  kg/m  Physical Exam Vitals and nursing note reviewed. Exam conducted with a chaperone present.  Constitutional:      General: She is not in acute distress.    Appearance: Normal appearance. She is well-developed.  HENT:     Head: Normocephalic and atraumatic.     Right Ear: External ear normal.     Left Ear: External ear normal.     Nose: Nose normal.  Eyes:     Extraocular Movements: Extraocular movements intact.     Conjunctiva/sclera: Conjunctivae normal.  Cardiovascular:     Rate and Rhythm: Normal rate and regular rhythm.     Heart sounds: No murmur heard. Pulmonary:     Effort: Pulmonary effort is normal. No respiratory distress.     Breath sounds: Normal breath sounds.  Abdominal:     Palpations: Abdomen is soft.     Tenderness: There is no abdominal tenderness.  Musculoskeletal:        General: No swelling.     Cervical back: Normal range of motion and neck supple.       Legs:     Comments: Patient has tenderness to palpation in the area noted above.  Skin without signs of fluctuance, induration, erythema, ecchymosis or signs of injury.  Patient has no midline tenderness, step-offs, or deformity noted.  Patient is neurovascularly  intact.  Skin:    General: Skin is warm and dry.     Capillary Refill: Capillary refill takes less than 2 seconds.  Neurological:     General: No focal deficit present.     Mental Status: She is alert.     Motor: No weakness.  Psychiatric:        Mood and Affect: Mood normal.     ED Results / Procedures / Treatments   Labs (all labs ordered are listed, but only abnormal results are displayed) Labs Reviewed - No data to display  EKG None  Radiology No results found.  Procedures Procedures    Medications Ordered in ED Medications  lidocaine (LIDODERM) 5 % 1 patch (has no administration in time range)  ibuprofen (ADVIL) tablet 800 mg (has no administration in time range)    ED Course/ Medical Decision Making/ A&P                                  Medical Decision Making Risk Prescription drug management.   This patient presents to the ED with chief complaint(s) of right sided back pain with pertinent past medical history of none which further complicates the presenting complaint. The complaint involves an extensive differential diagnosis and also carries with it a high risk of complications and morbidity.    The differential diagnosis includes musculoskeletal pain, abscess, fracture, shingles   ED Course and Reassessment: Patient given 800 mg ibuprofen and Lidoderm patch in ER   Consultation: - Consulted or discussed management/test interpretation w/ external professional: None  Consideration for admission or further workup: Consider for imaging or further workup however patient's vital signs and physical exam were reassuring.  Patient has no red flag signs or symptoms concerning for spinal injury, cauda equina syndrome, or spinal abscess.  Patient symptoms most consistent with musculoskeletal pain.  Patient started on short outpatient course of muscle relaxers and Lidoderm patches.  Patient should follow-up with PCP if symptoms persist for further evaluation and workup.         Final Clinical Impression(s) / ED Diagnoses Final diagnoses:  Musculoskeletal pain    Rx / DC Orders ED Discharge Orders          Ordered    methocarbamol (ROBAXIN) 750 MG tablet  Every 8 hours PRN,   Status:  Discontinued        05/20/23 1752    lidocaine (LIDODERM) 5 %  Every 24 hours        05/20/23 1752    methocarbamol (ROBAXIN) 750 MG tablet  Every 8 hours PRN        05/20/23 1753              Dolphus Jenny, PA-C 05/20/23 1800    Anders Simmonds T, DO 05/21/23 1642

## 2023-05-20 NOTE — ED Triage Notes (Addendum)
In for eval of upper buttock/sacral pain that started yesterday at approx 1400.  Denies injury. Was sitting on carpeted floor playing with her daughter. Took ibuprofen and muscle relaxer without relief.

## 2023-05-20 NOTE — Discharge Instructions (Addendum)
Seen for right lower back pain.  Please pick up your medication and take as prescribed.  You may also alternate taking Tylenol and Motrin as needed.  Please do not take Motrin for greater than 5 days and arises may cause rebound headaches.  Please follow-up with your PCP for further evaluation and workup if your pain persist.  Thank you for letting us treat you today. After performing a physical exam, I feel you are safe to go home. Please follow up with your PCP in the next several days and provide them with your records from this visit. Return to the Emergency Room if pain becomes severe or symptoms worsen.

## 2023-05-31 ENCOUNTER — Other Ambulatory Visit: Payer: Medicaid Other

## 2023-06-01 NOTE — Telephone Encounter (Signed)
HST- [facility] [employer] no auth req via fax form

## 2023-06-22 NOTE — Telephone Encounter (Signed)
HST- [facility] [employer] no auth req via fax form   Patient is scheduled at [facility] for 07/14/23 at 10:30 AM  Mailed packet to the patient.

## 2023-06-26 ENCOUNTER — Encounter: Payer: Self-pay | Admitting: Neurology

## 2023-07-04 ENCOUNTER — Other Ambulatory Visit: Payer: Medicaid Other

## 2023-07-14 ENCOUNTER — Ambulatory Visit: Payer: Medicaid Other | Admitting: Neurology

## 2023-07-14 DIAGNOSIS — G4733 Obstructive sleep apnea (adult) (pediatric): Secondary | ICD-10-CM | POA: Diagnosis not present

## 2023-07-14 DIAGNOSIS — G43009 Migraine without aura, not intractable, without status migrainosus: Secondary | ICD-10-CM

## 2023-07-14 DIAGNOSIS — R519 Headache, unspecified: Secondary | ICD-10-CM

## 2023-07-14 DIAGNOSIS — G4726 Circadian rhythm sleep disorder, shift work type: Secondary | ICD-10-CM

## 2023-07-14 DIAGNOSIS — R0683 Snoring: Secondary | ICD-10-CM

## 2023-07-17 NOTE — Progress Notes (Signed)
 [facility] 28 year old female [birth year]   HOME SLEEP TEST REPORT ( by Watch PAT)   STUDY DATE:  07-15-2023  07-17-2023   ORDERING CLINICIAN: Melvyn Novas, MD  REFERRING CLINICIAN:  Dr Lucia Gaskins, MD    CLINICAL INFORMATION/HISTORY: patient reported no primary care provider , more than 4 ED visits in 6 months.   Sheri Lawrence is a 28 y.o. female patient who is seen upon referral by Dr Lucia Gaskins on 05/13/2023 for a Sleep medicine evaluation, screening for OSA as a possible contributor to headache disorder.  Chief concern according to patient : Is after she reports that she had seen Dr. Lucia Gaskins upon an emergency room referral via Dr. Zadie Rhine.  This was for headaches . Onset of HA was after she had a visit with her hairstylist and had very tight braids placed.  The patient had migraines in the past but these headaches were different and the location for the headaches was different.  She describes an occipital neuralgic pain, and certainly the whole on her scalp at that region could have contributed to it.  She is no longer having the same has started now and her headaches improved.   The patient has a history of iron deficiency anemia -she had back pain in the past during the third trimester of pregnancy.  She has a history of fibroids.    Dr. Lucia Gaskins described that the pain was in the back of the head where the tightness was improved after the taking the braids out and that the specific headache that came on with a hairstyle is gone -but she still has headaches of other kind and migraines about 10 to 12 days a months. She wakes up with headaches frequently, those are dull , not pounding, not pulsatile, and go away after she takes ibuprofen po.  She has migraines , affecting the left or right temple, 1-2 a month. No aura. No nausea,  photophobia. These can last a day or longer if she doesn't take medication. She was prescribed Rizatriptan ( maxalt) which works  when taken early.   She reported her father had died in his sleep.     Epworth sleepiness score: 10/ 24 points   FSS endorsed at 9/ 63 points.   BMI:  42 .3 kg/m   Neck Circumference: 16.25"   FINDINGS:   Sleep Summary:   Total Recording Time (hours, min):   7 hours 10 minutes  Total Sleep Time (hours, min):     5 hours 31 minutes            Percent REM (%):     20%                                   Respiratory Indices:   Calculated pAHI (per hour):    By AASM criteria 71.5/h                         REM pAHI:     90/h                                            NREM pAHI:     67/h  Supine AHI:    The patient slept 253 minutes in supine the AHI here was 77/h.  There was 71 minutes in right lateral sleep recorded here the AHI was 650/h.  Snoring reached a mean volume of 46 dB which is considered loud and snoring was present for three quarters of the total recorded sleep time.                                               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  The oxygen nadir was not critical and the total time in hypoxia was not of significance either.  The sleep position did not influence the apnea hypopnea index.   RECOMMENDATION: Recommended treatment for this severity of sleep apnea is positive airway pressure therapy.  The risk factors for sleep apnea are overbite, morbid obesity, and the patient had more frequent brief hypoxic events when sleeping supine.  I will order an auto titration CPAP  device for her with factory settings, 8 the DME will fit her for a mask heated humidification should be included, and for NP follow-up with the patient through the [facility] sleep clinic with an day 60 and day 90 of CPAP therapy.  I need for this patient to establish primary care which can guide her through weight loss and preventive health programs.    INTERPRETING PHYSICIAN:   Melvyn Novas, MD  [facility] and [facility] Sleep Board certified by The [employer] of Sleep Medicine and Diplomate of the [employer] of Sleep Medicine. Board certified In Neurology through the ABPN, Fellow of the [employer] of Neurology.

## 2023-07-21 ENCOUNTER — Encounter: Payer: Self-pay | Admitting: Neurology

## 2023-07-21 DIAGNOSIS — G4733 Obstructive sleep apnea (adult) (pediatric): Secondary | ICD-10-CM | POA: Insufficient documentation

## 2023-07-21 NOTE — Procedures (Signed)
 [facility] 28 year old female 1995-12-31   HOME SLEEP TEST REPORT ( by Watch PAT)   STUDY DATE:  07-15-2023  07-17-2023   ORDERING CLINICIAN: Melvyn Novas, MD  REFERRING CLINICIAN:  Dr Lucia Gaskins, MD    CLINICAL INFORMATION/HISTORY: patient reported no primary care provider , more than 4 ED visits in 6 months.   Sheri Lawrence is a 28 y.o. female patient who is seen upon referral by Dr Lucia Gaskins on 05/13/2023 for a Sleep medicine evaluation, screening for OSA as a possible contributor to headache disorder.  Chief concern according to patient : Is after she reports that she had seen Dr. Lucia Gaskins upon an emergency room referral via Dr. Zadie Rhine.  This was for headaches . Onset of HA was after she had a visit with her hairstylist and had very tight braids placed.  The patient had migraines in the past but these headaches were different and the location for the headaches was different.  She describes an occipital neuralgic pain, and certainly the whole on her scalp at that region could have contributed to it.  She is no longer having the same has started now and her headaches improved.   The patient has a history of iron deficiency anemia -she had back pain in the past during the third trimester of pregnancy.  She has a history of fibroids.    Dr. Lucia Gaskins described that the pain was in the back of the head where the tightness was improved after the taking the braids out and that the specific headache that came on with a hairstyle is gone -but she still has headaches of other kind and migraines about 10 to 12 days a months. She wakes up with headaches frequently, those are dull , not pounding, not pulsatile, and go away after she takes ibuprofen po.  She has migraines , affecting the left or right temple, 1-2 a month. No aura. No nausea,  photophobia. These can last a day or longer if she doesn't take medication. She was prescribed Rizatriptan ( maxalt) which works when  taken early.  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  She is morbidly obese.  She reported her father had died in his sleep.     Epworth sleepiness score: 10/ 24 points   FSS endorsed at 9/ 63 points.   BMI:  42 .3 kg/m   Neck Circumference: 16.25"   FINDINGS:   Sleep Summary:   Total Recording Time (hours, min):   7 hours 10 minutes  Total Sleep Time (hours, min):     5 hours 31 minutes            Percent REM (%):     20%                                   Respiratory Indices:   Calculated pAHI (per hour):    By AASM criteria 71.5/h                         REM pAHI:     90/h                                            NREM pAHI:     67/h  Supine AHI:    The patient slept 253 minutes in supine the AHI here was 77/h.  There was 71 minutes in right lateral sleep recorded here the AHI was 650/h.  Snoring reached a mean volume of 46 dB which is considered loud and snoring was present for three quarters of the total recorded sleep time.                                               Oxygen Saturation Statistics:   Oxygen Saturation (%) Mean: 93%             O2 Saturation Range (%):      Between a minimum at 82 and a maximum of 99%                                 O2 Saturation (minutes) <89%:     3.4 minutes      Pulse Rate Statistics:   Pulse Mean (bpm):     91 bpm            Pulse Range:     Between 61 and 122 bpm            IMPRESSION: Calculated pAHI (per hour):    By AASM criteria 71.5/h      This HST confirms the presence of all obstructive severe sleep apnea associated with loud snoring and intermittent tachycardia.  The oxygen nadir was not critical and the total time in hypoxia was not of significance either.  The sleep position did not influence the apnea hypopnea index.   RECOMMENDATION: Recommended treatment for this severity of sleep apnea is positive airway pressure therapy.  The risk factors for sleep apnea are overbite, morbid obesity, and the patient  had more frequent brief hypoxic events when sleeping supine.  I will order an auto titration CPAP device for her with factory settings, 8 the DME will fit her for a mask heated humidification should be included, and for NP follow-up with the patient through the [facility] sleep clinic with an day 60 and day 90 of CPAP therapy.  I need for this patient to establish primary care which can guide her through weight loss and preventive health programs.    INTERPRETING PHYSICIAN:   Melvyn Novas, MD  [facility] and [facility] Sleep Board certified by The [employer] of Sleep Medicine and Diplomate of the [employer] of Sleep Medicine. Board certified In Neurology through the ABPN, Fellow of the [employer] of Neurology.

## 2023-07-21 NOTE — Addendum Note (Signed)
 Addended by: Melvyn Novas on: 07/21/2023 10:49 AM   Modules accepted: Orders

## 2023-07-22 ENCOUNTER — Telehealth: Payer: Self-pay | Admitting: *Deleted

## 2023-07-22 NOTE — Telephone Encounter (Addendum)
 Spoke to patient gave sleep study results Pt aware of severe OSA  Pt chose [employer] has DME  Gave pt [employer] # Pt informed of insurance compliance . Gave pt f/u appointment with Jessica,NP  Pt requested for sleep study to be sent to PCP. Sent orders to DME this afternoon. Pt expressed understanding and thanked me for calling

## 2023-07-22 NOTE — Telephone Encounter (Signed)
 LVM for pt to call back to review sleep study results

## 2023-07-22 NOTE — Telephone Encounter (Signed)
-----   Message from Harris Dohmeier sent at 07/21/2023 10:49 AM EST ----- Calculated pAHI (per hour): By AASM criteria 71.5/h This HST confirms the presence of all obstructive severe sleep apnea associated with loud snoring and intermittent tachycardia.  The oxygen nadir was not critical and the total time in hypoxia was not of significance either.  The sleep position did not influence the apnea hypopnea index.   RECOMMENDATION: Recommended treatment for this severity of sleep apnea is positive airway pressure therapy.  The risk factors for sleep apnea are overbite, morbid obesity, and the patient had more frequent brief hypoxic events when sleeping supine.    I will order an auto titration CPAP device for her with factory settings, the DME will fit her for a mask,  heated humidification should be included, and for NP follow-up with the patient through the [facility] sleep clinic with an day 60 and day 90 of CPAP therapy.  I need for this patient to establish primary care which can guide her through weight loss and preventive health programs.

## 2023-07-26 ENCOUNTER — Ambulatory Visit
Admission: RE | Admit: 2023-07-26 | Discharge: 2023-07-26 | Disposition: A | Discharge status: Home / Self Care | Payer: Medicaid Other | Source: Ambulatory Visit | Attending: Neurology | Admitting: Neurology

## 2023-07-26 DIAGNOSIS — G43009 Migraine without aura, not intractable, without status migrainosus: Secondary | ICD-10-CM | POA: Diagnosis not present

## 2023-07-26 DIAGNOSIS — R51 Headache with orthostatic component, not elsewhere classified: Secondary | ICD-10-CM

## 2023-07-26 DIAGNOSIS — G4719 Other hypersomnia: Secondary | ICD-10-CM

## 2023-07-26 DIAGNOSIS — R519 Headache, unspecified: Secondary | ICD-10-CM

## 2023-07-26 DIAGNOSIS — H539 Unspecified visual disturbance: Secondary | ICD-10-CM | POA: Diagnosis not present

## 2023-07-26 MED ORDER — GADOPICLENOL 0.5 MMOL/ML IV SOLN
10.0000 mL | Freq: Once | INTRAVENOUS | Status: AC | PRN
  Administered 2023-07-26: 10 mL via INTRAVENOUS

## 2023-07-27 ENCOUNTER — Encounter: Payer: Self-pay | Admitting: Neurology

## 2023-09-08 ENCOUNTER — Telehealth (INDEPENDENT_AMBULATORY_CARE_PROVIDER_SITE_OTHER): Payer: Medicaid Other | Admitting: Neurology

## 2023-09-08 ENCOUNTER — Encounter: Payer: Self-pay | Admitting: Neurology

## 2023-09-08 DIAGNOSIS — G4733 Obstructive sleep apnea (adult) (pediatric): Secondary | ICD-10-CM | POA: Diagnosis not present

## 2023-09-08 DIAGNOSIS — G43009 Migraine without aura, not intractable, without status migrainosus: Secondary | ICD-10-CM

## 2023-09-08 NOTE — Patient Instructions (Signed)
 Continue cpap for sleep apnea and follow up with appointment with sleep team in May  Sleep Apnea  Sleep apnea is a condition that affects your breathing while you are sleeping. Your tongue or soft tissue in your throat may block the flow of air while you sleep. You may have shallow breathing or stop breathing for short periods of time. People with sleep apnea may snore loudly. There are three kinds of sleep apnea: Obstructive sleep apnea. This kind is caused by a blocked or collapsed airway. This is the most common. Central sleep apnea. This kind happens when the part of the brain that controls breathing does not send the correct signals to the muscles that control breathing. Mixed sleep apnea. This is a combination of obstructive and central sleep apnea. What are the causes? The most common cause of sleep apnea is a collapsed or blocked airway. What increases the risk? Being very overweight. Having family members with sleep apnea. Having a tongue or tonsils that are larger than normal. Having a small airway or jaw problems. Being older. What are the signs or symptoms? Loud snoring. Restless sleep. Trouble staying asleep. Being sleepy or tired during the day. Waking up gasping or choking. Having a headache in the morning. Mood swings. Having a hard time remembering things and concentrating. How is this diagnosed? A medical history. A physical exam. A sleep study. This is also called a polysomnography test. This test is done at a sleep lab or in your home while you are sleeping. How is this treated? Treatment may include: Sleeping on your side. Losing weight if you're overweight. Wearing an oral appliance. This is a mouthpiece that moves your lower jaw forward. Using a positive airway pressure (PAP) device to keep your airways open while you sleep, such as: A continuous positive airway pressure (CPAP) device. This device gives forced air through a mask when you breathe out. This  keeps your airways open. A bilevel positive airway pressure (BIPAP) device. This device gives forced air through a mask when you breathe in and when you breathe out to keep your airways open. Having surgery if other treatments do not work. If your sleep apnea is not treated, you may be at risk for: Heart failure. Heart attack. Stroke. Type 2 diabetes or a problem with your blood sugar called insulin resistance. Follow these instructions at home: Medicines Take your medicines only as told by your health care provider. Avoid alcohol, medicines to help you relax, and certain pain medicines. These may make sleep apnea worse. General instructions Do not smoke, vape, or use products with nicotine or tobacco in them. If you need help quitting, talk with your provider. If you were given a PAP device to open your airway while you sleep, use it as told by your provider. If you're having surgery, make sure to tell your provider you have sleep apnea. You may need to bring your PAP device with you. Contact a health care provider if: The PAP device that you were given to use during sleep bothers you or does not seem to be working. You do not feel better or you feel worse. Get help right away if: You have trouble breathing. You have chest pain. You have trouble talking. One side of your body feels weak. A part of your face is hanging down. These symptoms may be an emergency. Call 911 right away. Do not wait to see if the symptoms will go away. Do not drive yourself to the hospital. This information  is not intended to replace advice given to you by your health care provider. Make sure you discuss any questions you have with your health care provider. Document Revised: 02/12/2023 Document Reviewed: 07/17/2022 Elsevier Patient Education  2024 [employer].

## 2023-09-08 NOTE — Progress Notes (Signed)
 [facility]    Provider:  Dr Lucia Gaskins Requesting Provider: No ref. provider found Primary Care Provider:  Pcp, No  CC:  Headache  Virtual Visit via Video Note  I connected with Sheri Lawrence on 09/08/23 at  8:30 AM EDT by a video enabled telemedicine application and verified that I am speaking with the correct person using two identifiers.  Location: Patient: home Provider: office   I discussed the limitations of evaluation and management by telemedicine and the availability of in person appointments. The patient expressed understanding and agreed to proceed.   Follow Up Instructions:    I discussed the assessment and treatment plan with the patient. The patient was provided an opportunity to ask questions and all were answered. The patient agreed with the plan and demonstrated an understanding of the instructions.   The patient was advised to call back or seek an in-person evaluation if the symptoms worsen or if the condition fails to improve as anticipated.  I provided 20 minutes of non-face-to-face time during this encounter.   Sheri Fret, MD   09/08/2023: Dxed with severe sleep apnea 71.5/h , has a f/u for cpap 09/2023 with Sheri Lawrence, not having any problems with cpap and feeling better. The headaches are not as frequent. Continue rizatriptan and the cpap. I discussed findings from sleep test, discussed sequelae of untreated sleep apnea, discussed weight loss, may discuss with sleep team ze[pbound (approved for sleep apnea and obesity)  MRI brain w/wo contrast: 07/26/2023: ORDERING CLINICIAN: Naomie Dean, MD CLINICAL HISTORY: 28 year old woman with headaches and visual changes COMPARISON FILMS: None   TECHNIQUE:MRI of the brain with and without contrast was obtained utilizing 5 mm axial slices with T1, T2, T2 flair, SWI and diffusion weighted views.  T1 sagittal, T2 coronal and postcontrast views in the axial and coronal plane were obtained.   CONTRAST: 10 ml [employer] IMAGING SITE: [facility], [address] [place], [place], [place]    FINDINGS: On sagittal images, the spinal cord is imaged caudally to C4-C5 and is normal in caliber.   The contents of the posterior fossa are of normal size and position.   The pituitary gland and optic chiasm appear normal.    Brain volume appears normal.   The ventricles are normal in size and without distortion.  There are no abnormal extra-axial collections of fluid.     The cerebellum and brainstem appears normal.   The deep gray matter appears normal.  The cerebral hemispheres appear normal.   Diffusion weighted images are normal.  Susceptibility weighted images are normal.      The orbits appear normal.   The VIIth/VIIIth nerve complex appears normal.  The mastoid air cells appear normal.  The paranasal sinuses appear normal.  Flow voids are identified within the major intracerebral arteries.     After the infusion of contrast material, a normal enhancement pattern is noted.     IMPRESSION: This is a normal MRI of the brain with and without contrast.   Patient complains of symptoms per HPI as well as the following symptoms: none . Pertinent negatives and positives per HPI. All others negative   HPI:  Sheri Lawrence is a 28 y.o. female here as requested by No ref. provider found for headache after getting hair done. has Supervision of normal first pregnancy, antepartum; Iron deficiency anemia; Back pain affecting pregnancy in third trimester; Fibroids; Indication for care in labor and delivery, antepartum; Sleep disorder, shift work; Morning headache; Migraine without aura and without  status migrainosus, not intractable; Morbid obesity (HCC); Snoring; and Severe obstructive sleep apnea-hypopnea syndrome on their problem list. She had tight braids and started getting headaches in that area and getting more migraines. The pain was in the back of the head, where the tightness was, improved after  taking it out the headache is gone but she still has headaches/,migraines since then for 11 days. She has a baseline migraine frequency of 4 migraine days a month and 10 total headache days a month. Migraines unilateral, pulsating/pounding/throbbing, light sensitivity and nausea, can last 4-24 hours, hurts to move, a dark room helps. She wakes up with headaches a lot, snores, excessivelt tired during the day. Morning headaches, worse in the mornings. Blurry vision,positional headache worse in the morning, worsening headaches, vision changes.. No other focal neurologic deficits, associated symptoms, inciting events or modifiable factors.    Reviewed notes, labs and imaging from outside physicians, which showed: propranolol and BP medications contraindicated due to low blood pressure, amitriptyline contraindicated due to sleep apnea, start Topiramate   Reviewed chart, patient was seen in the emergency room 04/10/2019 for, for headache for over a week, after she had her hair braided and started having pain in the back of her head, intermittent since then but daily, she took the braids up is still having episodes of headaches, no fevers or vomiting, no recent head trauma, headaches in multiple locations including at work, no exposures at work that could trigger the headache, evaluation was unremarkable including physical exam and neurologic exam reviewed, she has impaired access to primary care and she uses the ER frequently.  They did not feel at the time there is a need for neuroimaging, refer to outpatient neurology.  Reviewed the rest of patient's chart over the last year and did not see anything else of significance.  No results found for this or any previous visit (from the past 2160 hours).    Review of Systems: Patient complains of symptoms per HPI as well as the following symptoms None. Pertinent negatives and positives per HPI. All others negative.   Social History   Socioeconomic History    Marital status: Significant Other    Spouse name: Not on file   Number of children: Not on file   Years of education: Not on file   Highest education level: Bachelor's degree (e.g., BA, AB, BS)  Occupational History   Not on file  Tobacco Use   Smoking status: Never   Smokeless tobacco: Never  Vaping Use   Vaping status: Never Used  Substance and Sexual Activity   Alcohol use: Not Currently   Drug use: Never   Sexual activity: Not Currently    Birth control/protection: None  Other Topics Concern   Not on file  Social History Narrative   Pt lives with child    Pt works    Social Drivers of Corporate investment banker Strain: Not on Ship broker Insecurity: Not on file  Transportation Needs: Not on file  Physical Activity: Not on file  Stress: Not on file  Social Connections: Not on file  Intimate Partner Violence: Not on file    Family History  Problem Relation Age of Onset   Healthy Mother    Depression Father    Anxiety disorder Father    Other Father 86       "died in his sleep"   Alcohol abuse Maternal Grandfather    Migraines Neg Hx     Past Medical History:  Diagnosis  Date   Anemia    Anxiety    Depression    currently in therapy   Fibroid    Genital herpes    Migraines    UTI (urinary tract infection)     Patient Active Problem List   Diagnosis Date Noted   Severe obstructive sleep apnea-hypopnea syndrome 07/21/2023   Sleep disorder, shift work 05/13/2023   Morning headache 05/13/2023   Migraine without aura and without status migrainosus, not intractable 05/13/2023   Morbid obesity (HCC) 05/13/2023   Snoring 05/13/2023   Fibroids 02/12/2021   Indication for care in labor and delivery, antepartum 02/12/2021   Back pain affecting pregnancy in third trimester 02/08/2021   Iron deficiency anemia 12/08/2020   Supervision of normal first pregnancy, antepartum 08/21/2020    Past Surgical History:  Procedure Laterality Date   TONSILLECTOMY       Current Outpatient Medications  Medication Sig Dispense Refill   methocarbamol (ROBAXIN) 750 MG tablet Take 1 tablet (750 mg total) by mouth every 8 (eight) hours as needed for muscle spasms. 42 tablet 0   methylPREDNISolone (MEDROL DOSEPAK) 4 MG TBPK tablet Take pills daily all together with food. Take the first dose (6 pills) as soon as possible. Take the rest each morning. For 6 days total 6-5-4-3-2-1. 21 tablet 1   rizatriptan (MAXALT-MLT) 10 MG disintegrating tablet Take 1 tablet (10 mg total) by mouth as needed for migraine. May repeat in 2 hours if needed 9 tablet 11   valACYclovir (VALTREX) 1000 MG tablet Take 1,000 mg by mouth every 8 (eight) hours.     No current facility-administered medications for this visit.    Allergies as of 09/08/2023   (No Known Allergies)    Vitals: There were no vitals taken for this visit. Last Weight:  Wt Readings from Last 1 Encounters:  05/20/23 240 lb (108.9 kg)   Last Height:   Ht Readings from Last 1 Encounters:  05/20/23 5\' 3"  (1.6 m)    Physical exam: Exam: Gen: NAD, conversant      CV: No palpitations or chest pain or SOB. VS: Breathing at a normal rate. Weight obese. Not febrile. Eyes: Conjunctivae clear without exudates or hemorrhage  Neuro: Detailed Neurologic Exam  Speech:    Speech is normal; fluent and spontaneous with normal comprehension.  Cognition:    The patient is oriented to person, place, and time;     recent and remote memory intact;     language fluent;     normal attention, concentration, fund of knowledge Cranial Nerves:    The pupils are equal, round, and reactive to light. Visual fields are full Extraocular movements are intact.  The face is symmetric with normal sensation. The palate elevates in the midline. Hearing intact. Voice is normal. Shoulder shrug is normal. The tongue has normal motion without fasciculations.   Coordination: normal  Gait:    No abnormalities noted or reported  Motor  Observation:   no involuntary movements noted. Tone:    Appears normal  Posture:    Posture is normal. normal erect    Strength:    Strength is anti-gravity and symmetric in the upper and lower limbs.      Sensation: intact to LT, no reports of numbness or tingling or paresthesias          Assessment/Plan:  Patient with migraine/headaches improved after treating severe sleep apnea  Dxed with severe sleep apnea 71.5/h , has a f/u for cpap 09/2023 with Jessica Mccue,  not having any problems with cpap and feeling better. The headaches are not as frequent. Continue rizatriptan and the cpap.   Mri brain normal  - At [facility]NSET OF MIGRAINE: Rizatriptan: Please take one tablet at the onset of your headache. If it does not improve the symptoms please take one additional tablet. Do not take more then 2 tablets in 24hrs. Do not take use more then 2 to 3 times in a week.  No orders of the defined types were placed in this encounter.  No orders of the defined types were placed in this encounter.   Cc: No ref. provider found,  Pcp, No  Aldona Amel, MD  [facility] [address] [place], [place] [address]  Phone [phone] Fax [phone]

## 2023-09-29 ENCOUNTER — Encounter: Payer: Medicaid Other | Admitting: Adult Health

## 2024-01-07 NOTE — Progress Notes (Deleted)
 [facility] [address] [place]. [place] [address] [phone]       OFFICE FOLLOW UP NOTE  Ms. Sheri Lawrence Date of Birth:  07/31/1995 Medical Record Number:  [id number]    Primary neurologist: Dr. Ines Sleep neurologist: Dr. Chalice Reason for visit: Initial CPAP follow-up    SUBJECTIVE:   CHIEF COMPLAINT:  No chief complaint on file.   Follow-up visit:  Prior visit: 05/13/2023 with Dr. Chalice  Brief HPI:   Sheri Lawrence is a 28 y.o. female who was evaluated by Dr. Chalice in 04/2023 at the request of Dr. Ines for concern of underlying sleep apnea contributing to headache disorder.  HST 06/2023 showed severe sleep apnea with total AHI of 71.5/h and O2 nadir of 82%.  AutoPap set up 07/2023.       Interval history:  Patient is being seen today for initial CPAP compliance visit.      ROS:   14 system review of systems performed and negative with exception of those listed in HPI  PMH:  Past Medical History:  Diagnosis Date   Anemia    Anxiety    Depression    currently in therapy   Fibroid    Genital herpes    Migraines    UTI (urinary tract infection)     PSH:  Past Surgical History:  Procedure Laterality Date   TONSILLECTOMY      Social History:  Social History   Socioeconomic History   Marital status: Significant Other    Spouse name: Not on file   Number of children: Not on file   Years of education: Not on file   Highest education level: Bachelor's degree (e.g., BA, AB, BS)  Occupational History   Not on file  Tobacco Use   Smoking status: Never   Smokeless tobacco: Never  Vaping Use   Vaping status: Never Used  Substance and Sexual Activity   Alcohol use: Not Currently   Drug use: Never   Sexual activity: Not Currently    Birth control/protection: None  Other Topics Concern   Not on file  Social History Narrative   Pt lives with child    Pt works    Social Drivers of Manufacturing engineer Strain: Not on Ship broker Insecurity: Not on file  Transportation Needs: Not on file  Physical Activity: Not on file  Stress: Not on file  Social Connections: Not on file  Intimate Partner Violence: Not on file    Family History:  Family History  Problem Relation Age of Onset   Healthy Mother    Depression Father    Anxiety disorder Father    Other Father 35       died in his sleep   Alcohol abuse Maternal Grandfather    Migraines Neg Hx     Medications:   Current Outpatient Medications on File Prior to Visit  Medication Sig Dispense Refill   methocarbamol  (ROBAXIN ) 750 MG tablet Take 1 tablet (750 mg total) by mouth every 8 (eight) hours as needed for muscle spasms. 42 tablet 0   methylPREDNISolone  (MEDROL  DOSEPAK) 4 MG TBPK tablet Take pills daily all together with food. Take the first dose (6 pills) as soon as possible. Take the rest each morning. For 6 days total 6-5-4-3-2-1. 21 tablet 1   rizatriptan  (MAXALT -MLT) 10 MG disintegrating tablet Take 1 tablet (10 mg total) by mouth as needed for migraine. May repeat in 2 hours if needed 9 tablet 11  valACYclovir  (VALTREX ) 1000 MG tablet Take 1,000 mg by mouth every 8 (eight) hours.     No current facility-administered medications on file prior to visit.    Allergies:  No Known Allergies    OBJECTIVE:  Physical Exam  There were no vitals filed for this visit. There is no height or weight on file to calculate BMI. No results found.   General: well developed, well nourished, seated, in no evident distress Head: head normocephalic and atraumatic.   Neck: supple with no carotid or supraclavicular bruits Cardiovascular: regular rate and rhythm, no murmurs  Neurologic Exam Mental Status: Awake and fully alert. Oriented to place and time. Recent and remote memory intact. Attention span, concentration and fund of knowledge appropriate. Mood and affect appropriate.  Cranial Nerves: Pupils equal,  briskly reactive to light. Extraocular movements full without nystagmus. Visual fields full to confrontation. Hearing intact. Facial sensation intact. Face, tongue, palate moves normally and symmetrically.  Motor: Normal bulk and tone. Normal strength in all tested extremity muscles Gait and Station: Arises from chair without difficulty. Stance is normal. Gait demonstrates normal stride length and balance without use of AD.         ASSESSMENT/PLAN: Sheri Lawrence is a 28 y.o. year old female    OSA on CPAP :  Compliance report shows just satisfactory night usage at 70% and greater than 4 hr usage at 68%, residual AHI optimal Continue current pressure settings 5-20 with EPR 2 Discussed importance of nightly usage with ensuring greater than 4 hours nightly for optimal benefit and per insurance purposes.   DME [employer], continue to follow with DME company for any needed supplies or CPAP related concerns CPAP set up 09/2023     Follow up in *** or call earlier if needed   CC:  PCP: Pcp, No    I personally spent a total of *** minutes in the care of the patient today including {Time Based Coding:210964241}.     Harlene Bogaert, AGNP-BC  [facility] [address] [place], [place] [address]  Phone [phone] Fax [phone] Note: This document was prepared with digital dictation and possible smart phrase technology. Any transcriptional errors that result from this process are unintentional.

## 2024-01-11 ENCOUNTER — Encounter: Admitting: Adult Health

## 2024-01-11 ENCOUNTER — Telehealth: Payer: Self-pay | Admitting: Adult Health

## 2024-01-11 NOTE — Telephone Encounter (Signed)
 LVM and sent MyChart msg informing pt of appt cancellation today- NP out sick.

## 2024-03-02 ENCOUNTER — Encounter: Payer: Self-pay | Admitting: Adult Health

## 2024-03-02 ENCOUNTER — Ambulatory Visit: Admitting: Adult Health

## 2024-03-02 VITALS — BP 110/75 | HR 82 | Ht 64.0 in | Wt 227.0 lb

## 2024-03-02 DIAGNOSIS — G43009 Migraine without aura, not intractable, without status migrainosus: Secondary | ICD-10-CM | POA: Diagnosis not present

## 2024-03-02 DIAGNOSIS — G4733 Obstructive sleep apnea (adult) (pediatric): Secondary | ICD-10-CM

## 2024-03-02 NOTE — Progress Notes (Signed)
 [facility] [address] [place]. [place] [address] [phone]       OFFICE FOLLOW UP NOTE  Ms. Sheri Lawrence Date of Birth:  08/14/1995 Medical Record Number:  [id number]     Reason for visit: Initial CPAP follow-up    SUBJECTIVE:   CHIEF COMPLAINT:  Chief Complaint  Patient presents with   Obstructive Sleep Apnea    Rm 3 alone Pt is well and stable. Reports no OSA/CPAP concerns.     Follow-up visit:  Prior visit: 05/13/2023 with Dr. Chalice  Brief HPI:   Sheri Lawrence is a 28 y.o. female who was evaluated by Dr. Chalice in 04/2023 for concern of possible sleep disorder contributing to headaches.  HST 04/2023 showed severe sleep apnea with total AHI of 71.5/h, REM AHI of 90/h and supine AHI of 77/h with O2 nadir of 82%.  Recommended initiation of AutoPap therapy, set up 07/2023.   She did have follow-up visit with Dr. Ines 08/2023 who noted improvement of headaches since initiating CPAP.  She continued on rizatriptan  as needed for migraine rescue.    Interval history:  Patient is being seen for initial CPAP compliance visit.  Reports overall tolerating CPAP well.  She is using a fullface mask, can have occasional difficulty tolerating but otherwise no significant concerns.  Does note improvement of headaches on CPAP therapy.  She has not had any recent migraines.  She does work second shift and occasionally will fall asleep prior to putting mask on.  ESS 4/24, prior to CPAP 10/24. She has no questions or concerns at this time.       ROS:   14 system review of systems performed and negative with exception of those listed in HPI  PMH:  Past Medical History:  Diagnosis Date   Anemia    Anxiety    Depression    currently in therapy   Fibroid    Genital herpes    Migraines    UTI (urinary tract infection)     PSH:  Past Surgical History:  Procedure Laterality Date   TONSILLECTOMY      Social History:  Social History    Socioeconomic History   Marital status: Significant Other    Spouse name: Not on file   Number of children: Not on file   Years of education: Not on file   Highest education level: Bachelor's degree (e.g., BA, AB, BS)  Occupational History   Not on file  Tobacco Use   Smoking status: Never   Smokeless tobacco: Never  Vaping Use   Vaping status: Never Used  Substance and Sexual Activity   Alcohol use: Not Currently   Drug use: Never   Sexual activity: Not Currently    Birth control/protection: None  Other Topics Concern   Not on file  Social History Narrative   Pt lives with child    Pt works    Social Drivers of Corporate investment banker Strain: Not on Ship broker Insecurity: Not on file  Transportation Needs: Not on file  Physical Activity: Not on file  Stress: Not on file  Social Connections: Not on file  Intimate Partner Violence: Not on file    Family History:  Family History  Problem Relation Age of Onset   Healthy Mother    Depression Father    Anxiety disorder Father    Other Father 64       died in his sleep   Alcohol abuse Maternal Grandfather  Migraines Neg Hx     Medications:   Current Outpatient Medications on File Prior to Visit  Medication Sig Dispense Refill   valACYclovir  (VALTREX ) 1000 MG tablet Take 1,000 mg by mouth every 8 (eight) hours.     No current facility-administered medications on file prior to visit.    Allergies:  No Known Allergies    OBJECTIVE:  Physical Exam  Vitals:   03/02/24 1027  BP: 110/75  Pulse: 82  Weight: 227 lb (103 kg)  Height: 5' 4 (1.626 m)   Body mass index is 38.96 kg/m. No results found.   General: well developed, well nourished, seated, in no evident distress Head: head normocephalic and atraumatic.   Neck: supple with no carotid or supraclavicular bruits Cardiovascular: regular rate and rhythm, no murmurs  Neurologic Exam Mental Status: Awake and fully alert. Oriented to place  and time. Recent and remote memory intact. Attention span, concentration and fund of knowledge appropriate. Mood and affect appropriate.  Cranial Nerves: Pupils equal, briskly reactive to light. Extraocular movements full without nystagmus. Visual fields full to confrontation. Hearing intact. Facial sensation intact. Face, tongue, palate moves normally and symmetrically.  Motor: Normal bulk and tone. Normal strength in all tested extremity muscles Gait and Station: Arises from chair without difficulty. Stance is normal. Gait demonstrates normal stride length and balance without use of AD.         ASSESSMENT/PLAN: Sheri Lawrence is a 28 y.o. year old female    OSA on CPAP :  Compliance report shows suboptimal usage with optimal residual AHI.   Continue current pressure settings of 5-20 with EPR 2 Discussed importance of nightly usage with ensuring greater than 4 hours nightly for optimal benefit  Continue to follow with DME company [employer] for any needed supplies or CPAP related concerns CPAP set up 07/2023  Chronic migraine: Notes improvement of migraines after initiating CPAP which showed severe sleep apnea No recent migraines, continue Advil /Tylenol  as needed Advised to call with any worsening migraine headaches     Follow up in 1 year via mychart video visit or call earlier if needed     I personally spent a total of 25 minutes in the care of the patient today including preparing to see the patient, performing a medically appropriate exam/evaluation, counseling and educating, placing orders, and documenting clinical information in the EHR.  Harlene Bogaert, AGNP-BC  [facility] [address] [place], [place] [address]  Phone [phone] Fax [phone] Note: This document was prepared with digital dictation and possible smart phrase technology. Any transcriptional errors that result from this process are unintentional.

## 2024-03-02 NOTE — Progress Notes (Signed)
 Community message has been sent to [employer] for pressure and supplies on 03/02/24. DD

## 2024-03-02 NOTE — Patient Instructions (Addendum)
 Your Plan:  Continue nightly use of CPAP for adequate sleep apnea management   Please call with any increase in migraine headache     Follow up in 1 year or call earlier if needed      Thank you for coming to see us  at [facility]. I hope we have been able to provide you high quality care today.  You may receive a patient satisfaction survey over the next few weeks. We would appreciate your feedback and comments so that we may continue to improve ourselves and the health of our patients.

## 2024-04-04 ENCOUNTER — Encounter: Payer: Self-pay | Admitting: Emergency Medicine

## 2024-04-04 ENCOUNTER — Ambulatory Visit: Admission: EM | Admit: 2024-04-04 | Discharge: 2024-04-04 | Disposition: A | Discharge status: Home / Self Care

## 2024-04-04 DIAGNOSIS — H60502 Unspecified acute noninfective otitis externa, left ear: Secondary | ICD-10-CM

## 2024-04-04 MED ORDER — CIPROFLOXACIN-DEXAMETHASONE 0.3-0.1 % OT SUSP: 4.0000 [drp] | Freq: Two times a day (BID) | OTIC | 0 refills | Status: AC

## 2024-04-04 NOTE — ED Provider Notes (Signed)
 [facility]    CSN: [id number] Arrival date & time: 04/04/24  9141      History   Chief Complaint Chief Complaint  Patient presents with   Ear Fullness    L ear     HPI Sheri Lawrence is a 28 y.o. female.   Patient presents today due to left ear fullness and pain starting yesterday.  Patient states she tried to pour peroxide in her ear with no significant relief.  Patient states that she is able to hear out of her ear, but it is muffled.  The history is provided by the patient.  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   Morning headache 05/13/2023   Migraine without aura and without status migrainosus, not intractable 05/13/2023   Morbid obesity (HCC) 05/13/2023   Snoring 05/13/2023   Fibroids 02/12/2021   Indication for care in labor and delivery, antepartum 02/12/2021   Back pain affecting pregnancy in third trimester 02/08/2021   Iron deficiency anemia 12/08/2020   Supervision of normal first pregnancy, antepartum 08/21/2020    Past Surgical History:  Procedure Laterality Date   TONSILLECTOMY      OB History     Gravida  1   Para  1   Term  1   Preterm      AB      Living  1      SAB      IAB      Ectopic      Multiple  0   Live Births  1            Home Medications    Prior to Admission medications   Medication Sig Start Date End Date Taking? Authorizing Provider  ciprofloxacin -dexamethasone  (CIPRODEX ) OTIC suspension Place 4 drops into the left ear 2 (two) times daily for 7 days. 04/04/24 04/11/24 Yes Andra Krabbe C, PA-C  valACYclovir  (VALTREX ) 1000 MG tablet Take 1,000 mg by mouth every 8  (eight) hours. 05/19/23   [provider]    Family History Family History  Problem Relation Age of Onset   Healthy Mother    Depression Father    Anxiety disorder Father    Other Father 79       died in his sleep   Alcohol abuse Maternal Grandfather    Migraines Neg Hx     Social History Social History   Tobacco Use   Smoking status: Never    Passive exposure: Never   Smokeless tobacco: Never  Vaping Use   Vaping status: Never Used  Substance Use Topics   Alcohol use: Not Currently   Drug use: Never     Allergies   Patient has no known allergies.   Review of Systems Review of Systems   Physical Exam Triage Vital Signs ED Triage Vitals  Encounter Vitals Group     BP 04/04/24 1052 126/82     Girls Systolic BP Percentile --      Girls Diastolic BP Percentile --      Boys Systolic BP Percentile --      Boys Diastolic  BP Percentile --      Pulse Rate 04/04/24 1052 78     Resp 04/04/24 1052 18     Temp 04/04/24 1052 98 F (36.7 C)     Temp Source 04/04/24 1052 Oral     SpO2 04/04/24 1052 97 %     Weight 04/04/24 1052 227 lb 1.2 oz (103 kg)     Height --      Head Circumference --      Peak Flow --      Pain Score 04/04/24 1051 5     Pain Loc --      Pain Education --      Exclude from Growth Chart --    No data found.  Updated Vital Signs BP 126/82 (BP Location: Left Arm)   Pulse 78   Temp 98 F (36.7 C) (Oral)   Resp 18   Wt 227 lb 1.2 oz (103 kg)   LMP 03/29/2024 (Exact Date)   SpO2 97%   BMI 38.98 kg/m   Visual Acuity Right Eye Distance:   Left Eye Distance:   Bilateral Distance:    Right Eye Near:   Left Eye Near:    Bilateral Near:     Physical Exam Vitals and nursing note reviewed.  Constitutional:      General: She is not in acute distress.    Appearance: Normal appearance. She is not ill-appearing, toxic-appearing or diaphoretic.  HENT:     Head:     Comments: Tenderness to palpation of left tragus, no  tenderness to palpation with manipulation of left pinna, edema and erythema noted of left external ear canal    Right Ear: Tympanic membrane, ear canal and external ear normal.     Left Ear: There is impacted cerumen.  Eyes:     General: No scleral icterus. Cardiovascular:     Rate and Rhythm: Normal rate and regular rhythm.     Heart sounds: Normal heart sounds.  Pulmonary:     Effort: Pulmonary effort is normal. No respiratory distress.     Breath sounds: Normal breath sounds. No wheezing or rhonchi.  Skin:    General: Skin is warm.  Neurological:     Mental Status: She is alert and oriented to person, place, and time.  Psychiatric:        Mood and Affect: Mood normal.        Behavior: Behavior normal.      UC Treatments / Results  Labs (all labs ordered are listed, but only abnormal results are displayed) Labs Reviewed - No data to display  EKG   Radiology No results found.  Procedures Procedures (including critical care time)  Medications Ordered in UC Medications - No data to display  Initial Impression / Assessment and Plan / UC Course  I have reviewed the triage vital signs and the nursing notes.  Pertinent labs & imaging results that were available during my care of the patient were reviewed by me and considered in my medical decision making (see chart for details).   Cerumen removal completed of left ear, TM intact and within normal limits, erythema and edema noted of left external ear canal.  Final Clinical Impressions(s) / UC Diagnoses   Final diagnoses:  Acute otitis externa of left ear, unspecified type   Discharge Instructions   None    ED Prescriptions     Medication Sig Dispense Auth. Provider   ciprofloxacin -dexamethasone  (CIPRODEX ) OTIC suspension Place 4 drops into the left ear  2 (two) times daily for 7 days. 7.5 mL Andra Corean BROCKS, PA-C      PDMP not reviewed this encounter.   Andra Corean BROCKS, PA-C 04/04/24 1200

## 2024-04-04 NOTE — ED Triage Notes (Signed)
 Pt presents c/o L ear fullness x 1 day. Pt states,  My left ear feels clogged up. I been trying some ear movements to unclog it but it clogged right back up. There's also a little pain too.

## 2025-03-02 ENCOUNTER — Telehealth: Admitting: Adult Health
